# Patient Record
Sex: Female | Born: 1990 | Race: Asian | Hispanic: No | Smoking: Never smoker
Health system: Southern US, Community
[De-identification: ages and names within clinical notes are randomized; demographics above are authoritative.]

## PROBLEM LIST (undated history)

## (undated) DIAGNOSIS — D649 Anemia, unspecified: Secondary | ICD-10-CM

## (undated) DIAGNOSIS — Z87442 Personal history of urinary calculi: Secondary | ICD-10-CM

## (undated) DIAGNOSIS — N201 Calculus of ureter: Secondary | ICD-10-CM

## (undated) HISTORY — DX: Personal history of urinary calculi: Z87.442

## (undated) HISTORY — PX: FOOT SURGERY: SHX648

## (undated) HISTORY — DX: Anemia, unspecified: D64.9

---

## 2013-06-05 ENCOUNTER — Ambulatory Visit (INDEPENDENT_AMBULATORY_CARE_PROVIDER_SITE_OTHER): Payer: No Typology Code available for payment source | Admitting: Podiatry

## 2013-06-05 ENCOUNTER — Encounter: Payer: Self-pay | Admitting: Podiatry

## 2013-06-05 VITALS — BP 113/64 | HR 84 | Ht 65.0 in | Wt 131.0 lb

## 2013-06-05 DIAGNOSIS — M21619 Bunion of unspecified foot: Secondary | ICD-10-CM

## 2013-06-05 DIAGNOSIS — M216X9 Other acquired deformities of unspecified foot: Secondary | ICD-10-CM

## 2013-06-05 DIAGNOSIS — M2010 Hallux valgus (acquired), unspecified foot: Secondary | ICD-10-CM | POA: Insufficient documentation

## 2013-06-05 NOTE — Progress Notes (Signed)
Subjective: 23 year old female presents requesting her bunions fixed. She is on her feet at work 8-12 hours/day.   Review of Systems - General ROS: negative for - chills, fatigue, night sweats or sleep disturbance Ophthalmic ROS: negative ENT ROS: negative Allergy and Immunology ROS: negative Breast ROS: negative for breast lumps Respiratory ROS: no cough, shortness of breath, or wheezing Cardiovascular ROS: no chest pain or dyspnea on exertion Gastrointestinal ROS: no abdominal pain, change in bowel habits, or black or bloody stools Genito-Urinary ROS: no dysuria, trouble voiding, or hematuria Musculoskeletal ROS: negative Neurological ROS: no TIA or stroke symptoms Dermatological ROS: negative.  Objective: Dermatologic: Normotrophic skin without any lesions. Vascular: All pedal pulses are palpable. No edema or erythema noted. Neurologic: All epicritic and tactile sensations grossly intact.  Orthopedic: Severe hallux valgus with bunion deformity. Subluxed first MPJ bilateral. Hypermobility of the first ray bilateral.  Pes planus with weight bearing bilateral.   Assessment: Subluxed first MPJ with severe bunion bilateral. Hypermobile first ray bilateral.   Plan: Reviewed findings and available options; change in activity and shoe gear, orthotics, surgical options. Patient will return to discuss further on next visit.

## 2013-06-05 NOTE — Patient Instructions (Addendum)
Seen for bilateral bunion deformity. May benefit from Lapidus fusion procedure. Will contact patient with insurance into. Also need Custom orthotics.

## 2013-06-10 ENCOUNTER — Ambulatory Visit (INDEPENDENT_AMBULATORY_CARE_PROVIDER_SITE_OTHER): Payer: No Typology Code available for payment source | Admitting: Podiatry

## 2013-06-10 ENCOUNTER — Encounter: Payer: Self-pay | Admitting: Podiatry

## 2013-06-10 VITALS — BP 111/66 | HR 72 | Ht 65.0 in | Wt 131.0 lb

## 2013-06-10 DIAGNOSIS — M21619 Bunion of unspecified foot: Secondary | ICD-10-CM

## 2013-06-10 DIAGNOSIS — M2010 Hallux valgus (acquired), unspecified foot: Secondary | ICD-10-CM

## 2013-06-10 DIAGNOSIS — M216X9 Other acquired deformities of unspecified foot: Secondary | ICD-10-CM

## 2013-06-10 DIAGNOSIS — M21969 Unspecified acquired deformity of unspecified lower leg: Secondary | ICD-10-CM

## 2013-06-10 NOTE — Progress Notes (Signed)
Subjective: 23 year old female presents with her Grandfather requesting surgical intervention of her painful bunion, right foot this time.  Objective: No change in findings from previous visit. Severely deviated hallux with increased first intermetatarsal angle bilateral. Subluxed hallux on metatarsal head bilateral.  Severely hypermobile first metatarsal at the base.    Assessment: Severe HAV with bunion deformity bilateral. Severe hypermobility of the first MCJ bilateral.  Plan: Discussed the findings and available surgical options; Lapidus fusion of the first MCJ and McBride bunionectomy right. Surgery consent form was discussed.

## 2013-06-10 NOTE — Patient Instructions (Signed)
Seen for pre op consultation on right foot bunion.  Reviewed consent form for procedures: Lapidus fusion of the first Metatarsocuneiform joint right and McBride bunionectomy right.  All questions answered.  Please call if you have any further questions regard to the above procedures.

## 2013-06-13 DIAGNOSIS — Q66219 Congenital metatarsus primus varus, unspecified foot: Secondary | ICD-10-CM

## 2013-06-13 DIAGNOSIS — M2010 Hallux valgus (acquired), unspecified foot: Secondary | ICD-10-CM

## 2013-06-13 HISTORY — PX: OTHER SURGICAL HISTORY: SHX169

## 2013-06-18 ENCOUNTER — Encounter: Payer: Self-pay | Admitting: Podiatry

## 2013-06-18 ENCOUNTER — Ambulatory Visit (INDEPENDENT_AMBULATORY_CARE_PROVIDER_SITE_OTHER): Payer: No Typology Code available for payment source | Admitting: Podiatry

## 2013-06-18 VITALS — BP 108/58 | HR 69

## 2013-06-18 DIAGNOSIS — Z9889 Other specified postprocedural states: Secondary | ICD-10-CM

## 2013-06-18 DIAGNOSIS — M2010 Hallux valgus (acquired), unspecified foot: Secondary | ICD-10-CM

## 2013-06-18 DIAGNOSIS — M216X9 Other acquired deformities of unspecified foot: Secondary | ICD-10-CM

## 2013-06-18 DIAGNOSIS — M21969 Unspecified acquired deformity of unspecified lower leg: Secondary | ICD-10-CM

## 2013-06-18 NOTE — Progress Notes (Signed)
Subjective: 5 day post op patient presents accompanied by her grandfather reporting that she is doing well without problems with the foot or with medication. No fever or chills reported. Good color on all digits with movement right foot under cast. Walking with crutches. Return in 3 weeks.

## 2013-06-18 NOTE — Patient Instructions (Signed)
Follow up on 5 day post op. Doing well with cast and crutches. Walk in semi weight bearing with crutches. Return in 3 weeks.

## 2013-07-05 ENCOUNTER — Ambulatory Visit (INDEPENDENT_AMBULATORY_CARE_PROVIDER_SITE_OTHER): Payer: No Typology Code available for payment source | Admitting: Podiatry

## 2013-07-05 ENCOUNTER — Encounter: Payer: Self-pay | Admitting: Podiatry

## 2013-07-05 VITALS — BP 109/58 | HR 79

## 2013-07-05 DIAGNOSIS — M216X9 Other acquired deformities of unspecified foot: Secondary | ICD-10-CM

## 2013-07-05 DIAGNOSIS — M21969 Unspecified acquired deformity of unspecified lower leg: Secondary | ICD-10-CM

## 2013-07-05 DIAGNOSIS — M21619 Bunion of unspecified foot: Secondary | ICD-10-CM

## 2013-07-05 NOTE — Patient Instructions (Signed)
Cast replaced. Doing well. Continue to use crutches and may walk in semi-weight bearing. Return in 3 weeks.

## 2013-07-05 NOTE — Progress Notes (Signed)
3 weeks following Lapidus fusion with McBride right foot. Wearing Cast with walking heel on right lower limb. Stated that she has not been on pain pills for a while.  Cast removed. Wound is clean and dry without abnormal findings. X-ray show internal fixation in place with maintained correction, plantar flexed first ray right.  No new acute changes seen. Right lower limb placed in Fiberglass cast and cast boot dispensed.

## 2013-07-26 ENCOUNTER — Ambulatory Visit (INDEPENDENT_AMBULATORY_CARE_PROVIDER_SITE_OTHER): Payer: No Typology Code available for payment source | Admitting: Podiatry

## 2013-07-26 ENCOUNTER — Encounter: Payer: Self-pay | Admitting: Podiatry

## 2013-07-26 VITALS — BP 118/67 | HR 86

## 2013-07-26 DIAGNOSIS — M216X1 Other acquired deformities of right foot: Secondary | ICD-10-CM

## 2013-07-26 DIAGNOSIS — M21961 Unspecified acquired deformity of right lower leg: Secondary | ICD-10-CM

## 2013-07-26 DIAGNOSIS — M2010 Hallux valgus (acquired), unspecified foot: Secondary | ICD-10-CM

## 2013-07-26 DIAGNOSIS — M21969 Unspecified acquired deformity of unspecified lower leg: Secondary | ICD-10-CM

## 2013-07-26 DIAGNOSIS — M2011 Hallux valgus (acquired), right foot: Secondary | ICD-10-CM

## 2013-07-26 DIAGNOSIS — M216X9 Other acquired deformities of unspecified foot: Secondary | ICD-10-CM

## 2013-07-26 NOTE — Progress Notes (Signed)
6 weeks status post Lapidus with McBride right foot. Patient presents aided by crutches.  Stated that she is not taking pain medication any more.  Objective:  Right foot in cast with good maintenance.  Cast removed. Minimum edema noted. Well coapted surgical incision right foot.  Post op X-ray show internal fixation screws and plate in place. Maintained correction.   Assessment: Normal post op progress following Lapidus fusion and McBride right.   Plan: Right lower limb placed in CAM walker.  Instructed to walk in CAM walker and use crutches as needed.

## 2013-07-26 NOTE — Patient Instructions (Signed)
6 week post op. Doing well. May ambulate in CAM walker aided by crutches. Return in 3 weeks.

## 2013-08-16 ENCOUNTER — Encounter: Payer: Self-pay | Admitting: Podiatry

## 2013-08-16 ENCOUNTER — Ambulatory Visit (INDEPENDENT_AMBULATORY_CARE_PROVIDER_SITE_OTHER): Payer: No Typology Code available for payment source | Admitting: Podiatry

## 2013-08-16 VITALS — BP 113/66 | HR 74

## 2013-08-16 DIAGNOSIS — Z9889 Other specified postprocedural states: Secondary | ICD-10-CM

## 2013-08-16 NOTE — Progress Notes (Signed)
9 weeks status post Lapidus(06/13/13) right foot. Still wearing CAM walker.   Objective: Firm plantar flexed first ray. No edema noted. Wound has healed well.  Assessment: Satisfactory post op recovery from Lapidus fusion right foot.  Plan: Will try regular tennis shoes with Orthotics. Otc Orthotics dispensed.

## 2013-08-16 NOTE — Patient Instructions (Signed)
9 th week Post op check.  Wound healing is satisfactory. Doing well with CAM walker. May try regular tennis shoes with orthotics. OTC orthotics dispensed. Return in 3 weeks.

## 2013-09-06 ENCOUNTER — Ambulatory Visit (INDEPENDENT_AMBULATORY_CARE_PROVIDER_SITE_OTHER): Payer: No Typology Code available for payment source | Admitting: Podiatry

## 2013-09-06 ENCOUNTER — Encounter: Payer: Self-pay | Admitting: Podiatry

## 2013-09-06 VITALS — BP 103/64 | HR 79

## 2013-09-06 DIAGNOSIS — Z9889 Other specified postprocedural states: Secondary | ICD-10-CM

## 2013-09-06 NOTE — Patient Instructions (Signed)
12 weeks follow up on right foot surgery. Doing well. Increase activity as tolerated. Return as needed.

## 2013-09-06 NOTE — Progress Notes (Signed)
Subjective: 23 year old female presents follow up on Lapidus fusion right foot, 12 weeks now.  Able to walk comfortably and wears shoe inserts. Occasional mild edema noted by patient but no pain.  Objective: Solid first MCJ with good plantar flexion of the first ray right. Good ankle joint and first MPJ motion noted. No edema noted.   Assessment: Satisfactory post op recovery from Lapidus fusion right.  Plan: Increase activity as tolerated. Return as needed.

## 2015-10-02 ENCOUNTER — Inpatient Hospital Stay: Admit: 2015-10-02 | Payer: Self-pay | Admitting: Urology

## 2015-10-02 ENCOUNTER — Observation Stay (HOSPITAL_BASED_OUTPATIENT_CLINIC_OR_DEPARTMENT_OTHER)
Admission: EM | Admit: 2015-10-02 | Discharge: 2015-10-03 | Disposition: A | Discharge status: Home / Self Care | Payer: Managed Care, Other (non HMO) | Attending: Urology | Admitting: Urology

## 2015-10-02 ENCOUNTER — Emergency Department (HOSPITAL_COMMUNITY): Payer: Managed Care, Other (non HMO) | Admitting: Certified Registered Nurse Anesthetist

## 2015-10-02 ENCOUNTER — Emergency Department (HOSPITAL_BASED_OUTPATIENT_CLINIC_OR_DEPARTMENT_OTHER): Payer: Managed Care, Other (non HMO)

## 2015-10-02 ENCOUNTER — Encounter (HOSPITAL_COMMUNITY)
Admission: EM | Disposition: A | Discharge status: Home / Self Care | Payer: Self-pay | Source: Home / Self Care | Attending: Emergency Medicine

## 2015-10-02 ENCOUNTER — Encounter (HOSPITAL_BASED_OUTPATIENT_CLINIC_OR_DEPARTMENT_OTHER): Payer: Self-pay

## 2015-10-02 DIAGNOSIS — N136 Pyonephrosis: Secondary | ICD-10-CM | POA: Diagnosis not present

## 2015-10-02 DIAGNOSIS — N200 Calculus of kidney: Secondary | ICD-10-CM

## 2015-10-02 DIAGNOSIS — N390 Urinary tract infection, site not specified: Secondary | ICD-10-CM | POA: Insufficient documentation

## 2015-10-02 DIAGNOSIS — R109 Unspecified abdominal pain: Secondary | ICD-10-CM

## 2015-10-02 DIAGNOSIS — N201 Calculus of ureter: Secondary | ICD-10-CM | POA: Diagnosis present

## 2015-10-02 DIAGNOSIS — A419 Sepsis, unspecified organism: Secondary | ICD-10-CM | POA: Insufficient documentation

## 2015-10-02 HISTORY — PX: CYSTOSCOPY WITH STENT PLACEMENT: SHX5790

## 2015-10-02 LAB — COMPREHENSIVE METABOLIC PANEL
ALBUMIN: 4.6 g/dL (ref 3.5–5.0)
ALT: 17 U/L (ref 14–54)
AST: 20 U/L (ref 15–41)
Alkaline Phosphatase: 47 U/L (ref 38–126)
Anion gap: 11 (ref 5–15)
BUN: 12 mg/dL (ref 6–20)
CO2: 21 mmol/L — AB (ref 22–32)
CREATININE: 0.67 mg/dL (ref 0.44–1.00)
Calcium: 9.1 mg/dL (ref 8.9–10.3)
Chloride: 105 mmol/L (ref 101–111)
GFR calc Af Amer: >60 mL/min (ref >60)
Glucose, Bld: 97 mg/dL (ref 65–99)
Potassium: 3.3 mmol/L — ABNORMAL LOW (ref 3.5–5.1)
Sodium: 137 mmol/L (ref 135–145)
Total Bilirubin: 1.3 mg/dL — ABNORMAL HIGH (ref 0.3–1.2)
Total Protein: 7.4 g/dL (ref 6.5–8.1)

## 2015-10-02 LAB — URINE MICROSCOPIC-ADD ON

## 2015-10-02 LAB — URINALYSIS, ROUTINE W REFLEX MICROSCOPIC
Bilirubin Urine: NEGATIVE
GLUCOSE, UA: NEGATIVE mg/dL
Ketones, ur: 40 mg/dL — AB
NITRITE: POSITIVE — AB
Protein, ur: 30 mg/dL — AB
Specific Gravity, Urine: 1.019 (ref 1.005–1.030)
pH: 6 (ref 5.0–8.0)

## 2015-10-02 LAB — CBC WITH DIFFERENTIAL/PLATELET
BASOS ABS: 0 10*3/uL (ref 0.0–0.1)
Basophils Relative: 0 %
Eosinophils Absolute: 0 10*3/uL (ref 0.0–0.7)
Eosinophils Relative: 0 %
HCT: 33.7 % — ABNORMAL LOW (ref 36.0–46.0)
HEMOGLOBIN: 11.2 g/dL — AB (ref 12.0–15.0)
LYMPHS ABS: 0.4 10*3/uL — AB (ref 0.7–4.0)
LYMPHS PCT: 2 %
MCH: 27.9 pg (ref 26.0–34.0)
MCHC: 33.2 g/dL (ref 30.0–36.0)
MCV: 83.8 fL (ref 78.0–100.0)
Monocytes Absolute: 1.1 10*3/uL — ABNORMAL HIGH (ref 0.1–1.0)
Monocytes Relative: 6 %
NEUTROS ABS: 17.5 10*3/uL — AB (ref 1.7–7.7)
NEUTROS PCT: 92 %
PLATELETS: 182 10*3/uL (ref 150–400)
RBC: 4.02 MIL/uL (ref 3.87–5.11)
RDW: 12.1 % (ref 11.5–15.5)
WBC: 18.9 10*3/uL — AB (ref 4.0–10.5)

## 2015-10-02 LAB — PREGNANCY, URINE: PREG TEST UR: NEGATIVE

## 2015-10-02 SURGERY — CYSTOSCOPY, WITH STENT INSERTION
Anesthesia: General | Site: Urethra | Laterality: Left

## 2015-10-02 MED ORDER — DEXTROSE 5 % IV SOLN
1.0000 g | Freq: Once | INTRAVENOUS | Status: AC
  Administered 2015-10-02: 1 g via INTRAVENOUS
  Filled 2015-10-02: qty 10

## 2015-10-02 MED ORDER — STERILE WATER FOR IRRIGATION IR SOLN
Status: DC | PRN
  Administered 2015-10-02: 3000 mL via INTRAVESICAL

## 2015-10-02 MED ORDER — ONDANSETRON HCL 4 MG/2ML IJ SOLN: 4.0000 mg | INTRAMUSCULAR | Status: DC | PRN

## 2015-10-02 MED ORDER — FENTANYL CITRATE (PF) 100 MCG/2ML IJ SOLN
INTRAMUSCULAR | Status: DC | PRN
  Administered 2015-10-02 (×2): 50 ug via INTRAVENOUS

## 2015-10-02 MED ORDER — LACTATED RINGERS IV SOLN
INTRAVENOUS | Status: DC | PRN
  Administered 2015-10-02: 22:00:00 via INTRAVENOUS

## 2015-10-02 MED ORDER — OXYCODONE-ACETAMINOPHEN 5-325 MG PO TABS: 1.0000 | ORAL_TABLET | ORAL | Status: DC | PRN

## 2015-10-02 MED ORDER — HYDROMORPHONE HCL 1 MG/ML IJ SOLN: 0.5000 mg | INTRAMUSCULAR | Status: DC | PRN

## 2015-10-02 MED ORDER — HYDROMORPHONE HCL 1 MG/ML IJ SOLN: 0.2500 mg | INTRAMUSCULAR | Status: DC | PRN

## 2015-10-02 MED ORDER — SUCCINYLCHOLINE CHLORIDE 20 MG/ML IJ SOLN
INTRAMUSCULAR | Status: DC | PRN
  Administered 2015-10-02: 60 mg via INTRAVENOUS

## 2015-10-02 MED ORDER — DIPHENHYDRAMINE HCL 12.5 MG/5ML PO ELIX: 12.5000 mg | ORAL_SOLUTION | Freq: Four times a day (QID) | ORAL | Status: DC | PRN

## 2015-10-02 MED ORDER — MIDAZOLAM HCL 5 MG/5ML IJ SOLN
INTRAMUSCULAR | Status: DC | PRN
  Administered 2015-10-02: 2 mg via INTRAVENOUS

## 2015-10-02 MED ORDER — PHENYLEPHRINE HCL 10 MG/ML IJ SOLN
INTRAMUSCULAR | Status: DC | PRN
  Administered 2015-10-02 (×3): 80 ug via INTRAVENOUS

## 2015-10-02 MED ORDER — DEXTROSE 5 % IV SOLN
2.0000 g | INTRAVENOUS | Status: DC
  Filled 2015-10-02: qty 2

## 2015-10-02 MED ORDER — DIPHENHYDRAMINE HCL 50 MG/ML IJ SOLN
12.5000 mg | Freq: Four times a day (QID) | INTRAMUSCULAR | Status: DC | PRN
Start: 2015-10-02 — End: 2015-10-03

## 2015-10-02 MED ORDER — KETOROLAC TROMETHAMINE 30 MG/ML IJ SOLN
30.0000 mg | Freq: Once | INTRAMUSCULAR | Status: AC
  Administered 2015-10-02: 30 mg via INTRAVENOUS
  Filled 2015-10-02: qty 1

## 2015-10-02 MED ORDER — ZOLPIDEM TARTRATE 5 MG PO TABS: 5.0000 mg | ORAL_TABLET | Freq: Every evening | ORAL | Status: DC | PRN

## 2015-10-02 MED ORDER — HYOSCYAMINE SULFATE 0.125 MG SL SUBL
0.1250 mg | SUBLINGUAL_TABLET | SUBLINGUAL | Status: DC | PRN
  Filled 2015-10-02: qty 1

## 2015-10-02 MED ORDER — PROMETHAZINE HCL 25 MG/ML IJ SOLN
6.2500 mg | INTRAMUSCULAR | Status: DC | PRN
Start: 2015-10-02 — End: 2015-10-02

## 2015-10-02 MED ORDER — SODIUM CHLORIDE 0.9 % IV SOLN
Freq: Once | INTRAVENOUS | Status: AC
  Administered 2015-10-02: 20:00:00 via INTRAVENOUS

## 2015-10-02 MED ORDER — MEPERIDINE HCL 50 MG/ML IJ SOLN: 6.2500 mg | INTRAMUSCULAR | Status: DC | PRN

## 2015-10-02 MED ORDER — SODIUM CHLORIDE 0.9 % IV SOLN
INTRAVENOUS | Status: DC | PRN
  Administered 2015-10-02: 8 mL

## 2015-10-02 MED ORDER — ONDANSETRON HCL 4 MG/2ML IJ SOLN
INTRAMUSCULAR | Status: DC | PRN
  Administered 2015-10-02: 4 mg via INTRAVENOUS

## 2015-10-02 MED ORDER — ACETAMINOPHEN 325 MG PO TABS: 650.0000 mg | ORAL_TABLET | ORAL | Status: DC | PRN

## 2015-10-02 MED ORDER — SODIUM CHLORIDE 0.9 % IV SOLN
INTRAVENOUS | Status: DC
  Administered 2015-10-03 (×2): via INTRAVENOUS

## 2015-10-02 MED ORDER — LIDOCAINE HCL (CARDIAC) 20 MG/ML IV SOLN
INTRAVENOUS | Status: DC | PRN
  Administered 2015-10-02: 50 mg via INTRAVENOUS

## 2015-10-02 MED ORDER — PROPOFOL 10 MG/ML IV BOLUS
INTRAVENOUS | Status: DC | PRN
  Administered 2015-10-02: 150 mg via INTRAVENOUS

## 2015-10-02 MED ORDER — LACTATED RINGERS IV SOLN: INTRAVENOUS | Status: DC

## 2015-10-02 SURGICAL SUPPLY — 13 items
BAG URO CATCHER STRL LF (MISCELLANEOUS) ×3 IMPLANT
CATH FOLEY 2WAY SLVR  5CC 16FR (CATHETERS) ×2
CATH FOLEY 2WAY SLVR 5CC 16FR (CATHETERS) ×1 IMPLANT
CATH INTERMIT  6FR 70CM (CATHETERS) ×3 IMPLANT
CLOTH BEACON ORANGE TIMEOUT ST (SAFETY) ×3 IMPLANT
GLOVE BIOGEL M 8.0 STRL (GLOVE) ×3 IMPLANT
GOWN STRL REUS W/TWL LRG LVL3 (GOWN DISPOSABLE) ×6 IMPLANT
GUIDEWIRE STR DUAL SENSOR (WIRE) ×3 IMPLANT
MANIFOLD NEPTUNE II (INSTRUMENTS) ×3 IMPLANT
PACK CYSTO (CUSTOM PROCEDURE TRAY) ×3 IMPLANT
STENT URET 6FRX24 CONTOUR (STENTS) ×3 IMPLANT
TUBING CONNECTING 10 (TUBING) ×2 IMPLANT
TUBING CONNECTING 10' (TUBING) ×1

## 2015-10-02 NOTE — H&P (Signed)
Urology Admission H&P  Chief Complaint: left flank pain  History of Present Illness: Alejandra Russo is a 25yo with no significant past medical history who presented to [facility] with a 1 day history of left sided abdominal and flank pain. The pain is sharp, constant, severe, nonradiating with associated nausea and vomiting. She also has urgency, dysuria. No exacerbating/alleviating events WBC count is 19 and Urine is concerning for infection. CT scan shows a 4mm left distal ureterla calculus with moderate hydronephrosis  History reviewed. No pertinent past medical history. Past Surgical History:  Procedure Laterality Date  . FOOT SURGERY    . Lapidus Fusion Right 06/13/2013   @ [facility]  . Adin Hector Bunionectomy Right 06/13/2013   @ [facility]    Home Medications:   (Not in a hospital admission) Allergies: No Known Allergies  History reviewed. No pertinent family history. Social History:  reports that she has never smoked. She has never used smokeless tobacco. She reports that she drinks alcohol. She reports that she does not use drugs.  Review of Systems  Gastrointestinal: Positive for nausea and vomiting.  Genitourinary: Positive for dysuria, flank pain, frequency and urgency.  All other systems reviewed and are negative.   Physical Exam:  Vital signs in last 24 hours: Temp:  [98.2 F (36.8 C)] 98.2 F (36.8 C) (09/01 1652) Pulse Rate:  [98-118] 98 (09/01 1857) Resp:  [18-20] 18 (09/01 1857) BP: (103-106)/(69-95) 103/69 (09/01 1857) SpO2:  [100 %] 100 % (09/01 1857) Weight:  [62.1 kg (137 lb)] 62.1 kg (137 lb) (09/01 1652) Physical Exam  Constitutional: She is oriented to person, place, and time. She appears well-developed and well-nourished.  HENT:  Head: Normocephalic and atraumatic.  Eyes: EOM are normal. Pupils are equal, round, and reactive to light.  Neck: Normal range of motion. No thyromegaly present.  Cardiovascular: Normal rate and regular rhythm.   Respiratory:  Effort normal. No respiratory distress.  GI: Soft. She exhibits no distension and no mass. There is no tenderness. There is no rebound and no guarding.  Musculoskeletal: Normal range of motion. She exhibits no edema.  Neurological: She is alert and oriented to person, place, and time.  Skin: Skin is warm and dry.  Psychiatric: She has a normal mood and affect. Her behavior is normal. Judgment and thought content normal.    Laboratory Data:  Results for orders placed or performed during the hospital encounter of 10/02/15 (from the past 24 hour(s))  Pregnancy, urine     Status: None   Collection Time: 10/02/15  5:20 PM  Result Value Ref Range   Preg Test, Ur NEGATIVE NEGATIVE  Urinalysis, Routine w reflex microscopic (not at [facility])     Status: Abnormal   Collection Time: 10/02/15  5:20 PM  Result Value Ref Range   Color, Urine AMBER (A) YELLOW   APPearance CLOUDY (A) CLEAR   Specific Gravity, Urine 1.019 1.005 - 1.030   pH 6.0 5.0 - 8.0   Glucose, UA NEGATIVE NEGATIVE mg/dL   Hgb urine dipstick LARGE (A) NEGATIVE   Bilirubin Urine NEGATIVE NEGATIVE   Ketones, ur 40 (A) NEGATIVE mg/dL   Protein, ur 30 (A) NEGATIVE mg/dL   Nitrite POSITIVE (A) NEGATIVE   Leukocytes, UA LARGE (A) NEGATIVE  Urine microscopic-add on     Status: Abnormal   Collection Time: 10/02/15  5:20 PM  Result Value Ref Range   Squamous Epithelial / LPF 0-5 (A) NONE SEEN   WBC, UA 6-30 0 - 5 WBC/hpf  RBC / HPF TOO NUMEROUS TO COUNT 0 - 5 RBC/hpf   Bacteria, UA MANY (A) NONE SEEN   Urine-Other MUCOUS PRESENT   Comprehensive metabolic panel     Status: Abnormal   Collection Time: 10/02/15  5:30 PM  Result Value Ref Range   Sodium 137 135 - 145 mmol/L   Potassium 3.3 (L) 3.5 - 5.1 mmol/L   Chloride 105 101 - 111 mmol/L   CO2 21 (L) 22 - 32 mmol/L   Glucose, Bld 97 65 - 99 mg/dL   BUN 12 6 - 20 mg/dL   Creatinine, Ser 6.040.67 0.44 - 1.00 mg/dL   Calcium 9.1 8.9 - 54.010.3 mg/dL   Total Protein 7.4 6.5 - 8.1 g/dL    Albumin 4.6 3.5 - 5.0 g/dL   AST 20 15 - 41 U/L   ALT 17 14 - 54 U/L   Alkaline Phosphatase 47 38 - 126 U/L   Total Bilirubin 1.3 (H) 0.3 - 1.2 mg/dL   GFR calc non Af Amer >60 >60 mL/min   GFR calc Af Amer >60 >60 mL/min   Anion gap 11 5 - 15  CBC with Differential     Status: Abnormal   Collection Time: 10/02/15  5:30 PM  Result Value Ref Range   WBC 18.9 (H) 4.0 - 10.5 K/uL   RBC 4.02 3.87 - 5.11 MIL/uL   Hemoglobin 11.2 (L) 12.0 - 15.0 g/dL   HCT 98.133.7 (L) 19.136.0 - 47.846.0 %   MCV 83.8 78.0 - 100.0 fL   MCH 27.9 26.0 - 34.0 pg   MCHC 33.2 30.0 - 36.0 g/dL   RDW 29.512.1 62.111.5 - 30.815.5 %   Platelets 182 150 - 400 K/uL   Neutrophils Relative % 92 %   Neutro Abs 17.5 (H) 1.7 - 7.7 K/uL   Lymphocytes Relative 2 %   Lymphs Abs 0.4 (L) 0.7 - 4.0 K/uL   Monocytes Relative 6 %   Monocytes Absolute 1.1 (H) 0.1 - 1.0 K/uL   Eosinophils Relative 0 %   Eosinophils Absolute 0.0 0.0 - 0.7 K/uL   Basophils Relative 0 %   Basophils Absolute 0.0 0.0 - 0.1 K/uL   No results found for this or any previous visit (from the past 240 hour(s)). Creatinine:  Recent Labs  10/02/15 1730  CREATININE 0.67   Baseline Creatinine: 0.7  Impression/Assessment:  25yo with left ureteral calculus, sepsis from a urinary source  Plan:  1. The risks/benefits/alternatives to left ureteral stent placement was explained to the patient and she understands and wishes to proceed with surgery 2. She will be admitted for IV antibiotics and CV monitoring post op.   Wilkie Ayeatrick Micaiah Remillard 10/02/2015, 8:36 PM

## 2015-10-02 NOTE — ED Notes (Addendum)
Pt from [facility] via [facility]---- was [facility] for further evaluation and treatment of hydronephrosis from multiple kidney stones.  Pt arrived to Rm 24 A/Ox4, denies pain and in no s/s apparent distress.  On-going NS at 100 ml/hr to right AC infusing well.

## 2015-10-02 NOTE — ED Notes (Signed)
Dr. Ronne BinningMcKenzie, general surgeon, at bedside at this time.

## 2015-10-02 NOTE — ED Notes (Signed)
Patient transported to CT 

## 2015-10-02 NOTE — ED Provider Notes (Signed)
Emergency Department Provider Note   I have reviewed the triage vital signs and the nursing notes.   HISTORY  Chief Complaint Abdominal Pain   HPI Barton Fannyshlie Ormand is a 25 y.o. female with no significant PMH presents to the emergency room for evaluation of sudden onset left upper quadrant pain that has been intermittent since 1 PM today. Patient with no prior history of similar pain episodes. She denies associated vomiting, chest pain, difficulty breathing. She notes possible onset with eating. No diarrhea. No lower abdominal discomfort. Denies vaginal bleeding or discharge. LMP was 09/08/2015. Patient went to urgent care and was found to have hematuria and referred to the emergency department for further evaluation. No history of kidney stone.    History reviewed. No pertinent past medical history.  Patient Active Problem List   Diagnosis Date Noted  . Status post foot surgery 06/18/2013  . Metatarsal deformity 06/10/2013  . 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 Bunion 06/05/2013  . HAV (hallux abducto valgus) 06/05/2013    Past Surgical History:  Procedure Laterality Date  . FOOT SURGERY    . Lapidus Fusion Right 06/13/2013   @ [facility]  . Adin HectorMcBride Bunionectomy Right 06/13/2013   @ [facility]      Allergies Review of patient's allergies indicates no known allergies.  History reviewed. No pertinent family history.  Social History Social History  Substance Use Topics  . Smoking status: Never Smoker  . Smokeless tobacco: Never Used  . Alcohol use Yes     Comment: ooc    Review of Systems  Constitutional: No fever/chills Eyes: No visual changes. ENT: No sore throat. Cardiovascular: Denies chest pain. Respiratory: Denies shortness of breath. Gastrointestinal: LUQ abdominal pain.  No nausea, no vomiting.  No diarrhea.  No constipation. Genitourinary: Negative for dysuria. Musculoskeletal: Negative for back pain. Skin: Negative for rash. Neurological:  Negative for headaches, focal weakness or numbness.  10-point ROS otherwise negative.  ____________________________________________   PHYSICAL EXAM:  VITAL SIGNS: ED Triage Vitals  Enc Vitals Group     BP 10/02/15 1652 106/95     Pulse Rate 10/02/15 1652 118     Resp 10/02/15 1652 20     Temp 10/02/15 1652 98.2 F (36.8 C)     Temp Source 10/02/15 1652 Oral     SpO2 10/02/15 1652 100 %     Weight 10/02/15 1652 137 lb (62.1 kg)     Height 10/02/15 1652 5\' 4"  (1.626 m)     Pain Score 10/02/15 1651 6   Constitutional: Alert and oriented. Well appearing and in no acute distress. Eyes: Conjunctivae are normal. . Head: Atraumatic. Nose: No congestion/rhinnorhea. Mouth/Throat: Mucous membranes are moist.  Oropharynx non-erythematous. Neck: No stridor.   Cardiovascular: Sinus tachycardia. Good peripheral circulation. Grossly normal heart sounds.   Respiratory: Normal respiratory effort.  No retractions. Lungs CTAB. Gastrointestinal: Soft and nontender. No distention. Mild left CVA tenderness to percussion.  Musculoskeletal: No lower extremity tenderness nor edema. No gross deformities of extremities. Neurologic:  Normal speech and language. No gross focal neurologic deficits are appreciated.  Skin:  Skin is warm, dry and intact. No rash noted. Psychiatric: Mood and affect are normal. Speech and behavior are normal.  ____________________________________________   LABS (all labs ordered are listed, but only abnormal results are displayed)  Labs Reviewed  URINALYSIS, ROUTINE W REFLEX MICROSCOPIC (NOT AT [facility]) - Abnormal; Notable for the following:       Result Value   Color, Urine AMBER (*)  APPearance CLOUDY (*)    Hgb urine dipstick LARGE (*)    Ketones, ur 40 (*)    Protein, ur 30 (*)    Nitrite POSITIVE (*)    Leukocytes, UA LARGE (*)    All other components within normal limits  COMPREHENSIVE METABOLIC PANEL - Abnormal; Notable for the following:    Potassium 3.3 (*)     CO2 21 (*)    Total Bilirubin 1.3 (*)    All other components within normal limits  CBC WITH DIFFERENTIAL/PLATELET - Abnormal; Notable for the following:    WBC 18.9 (*)    Hemoglobin 11.2 (*)    HCT 33.7 (*)    Neutro Abs 17.5 (*)    Lymphs Abs 0.4 (*)    Monocytes Absolute 1.1 (*)    All other components within normal limits  URINE MICROSCOPIC-ADD ON - Abnormal; Notable for the following:    Squamous Epithelial / LPF 0-5 (*)    Bacteria, UA MANY (*)    All other components within normal limits  PREGNANCY, URINE   ____________________________________________  RADIOLOGY  Ct Renal Stone Study  Result Date: 10/02/2015 CLINICAL DATA:  25 year old female with acute left flank and abdominal pain. EXAM: CT ABDOMEN AND PELVIS WITHOUT CONTRAST TECHNIQUE: Multidetector CT imaging of the abdomen and pelvis was performed following the standard protocol without IV contrast. COMPARISON:  None. FINDINGS: Please note that parenchymal abnormalities may be missed without intravenous contrast. Lower chest:  Unremarkable Hepatobiliary: The liver and gallbladder are unremarkable. There is no evidence of biliary dilatation. Pancreas: Unremarkable Spleen: Unremarkable Adrenals/Urinary Tract: Mild left hydroureteronephrosis noted into the pelvis. A 4 mm calcification in the region the distal left ureter probably represents a distal ureteral calculus. Multiple bilateral nonobstructing renal calculi are identified. The adrenal glands and bladder are unremarkable. Stomach/Bowel: Unremarkable. No evidence of bowel obstruction. The appendix is normal. Vascular/Lymphatic: Unremarkable. No enlarged lymph nodes or abdominal aortic aneurysm. Reproductive: Unremarkable Other: No free fluid or pneumoperitoneum. Musculoskeletal: No acute or suspicious abnormalities. IMPRESSION: Mild left hydronephrosis from probable 4 mm distal left ureteral calculus. Multiple bilateral nonobstructing renal calculi. Electronically Signed    By: Harmon Pier M.D.   On: 10/02/2015 18:33    ____________________________________________   PROCEDURES  Procedure(s) performed:   Procedures  None ____________________________________________   INITIAL IMPRESSION / ASSESSMENT AND PLAN / ED COURSE  Pertinent labs & imaging results that were available during my care of the patient were reviewed by me and considered in my medical decision making (see chart for details).  Patient presents to the emergency department for evaluation of left upper quadrant abdominal discomfort. Symptoms are intermittent. Noted to have hematuria at urgent care. No history of kidney stones this time a differential. Also obtain urine pregnancy with low suspicion for ectopic given no lower abdominal discomfort or vaginal bleeding. Patient is no right lower quadrant abdominal discomfort. No Murphy sign. Plan for baseline labs and Toradol pending pregnancy. Would consider a noncontrast CT scan of the abdomen and pelvis if no other clear source of patient's discomfort is found evaluate for kidney stone.   06:06 PM Patient with evidence of infection on urinalysis. No history of fever. Patient does have some CVA tenderness. Likely pyelonephritis but given the patient's severe intermittent sudden onset pain as her presenting complaint I plan to obtain CT scan of the abdomen and pelvis without contrast to rule out kidney stone with resulting infectious process.   07:25 PM Spoke with Dr. Ronne Binning with Urology regarding CT and  UTI. Plan to transfer to [facility] ED for urology evaluation and likely admission for stenting.    07:46 PM Spoke with Dr. Hedwig Morton, EPD at [facility] ED. Plan for ED-ED transfer for Urology to see. Updated patient and discussed care plan. Pain well controlled. No fever.  ____________________________________________  FINAL CLINICAL IMPRESSION(S) / ED DIAGNOSES  Final diagnoses:  Kidney stone  UTI (lower urinary tract infection)     MEDICATIONS GIVEN  DURING THIS VISIT:  Medications  ketorolac (TORADOL) 30 MG/ML injection 30 mg (30 mg Intravenous Given 10/02/15 1752)  cefTRIAXone (ROCEPHIN) 1 g in dextrose 5 % 50 mL IVPB (0 g Intravenous Stopped 10/02/15 1905)     NEW OUTPATIENT MEDICATIONS STARTED DURING THIS VISIT:  None   Note:  This document was prepared using Dragon voice recognition software and may include unintentional dictation errors.  Alona Bene, MD Emergency Medicine   Maia Plan, MD 10/03/15 [id number]

## 2015-10-02 NOTE — ED Notes (Signed)
Resting quietly, comfort measures provided, ED provider informed of K level of 3.3

## 2015-10-02 NOTE — Anesthesia Preprocedure Evaluation (Signed)
Anesthesia Evaluation  Patient identified by MRN, date of birth, ID band Patient awake    Reviewed: Allergy & Precautions, NPO status , Patient's Chart, lab work & pertinent test results  Airway Mallampati: II  TM Distance: >3 FB Neck ROM: Full    Dental no notable dental hx.    Pulmonary neg pulmonary ROS,    Pulmonary exam normal breath sounds clear to auscultation       Cardiovascular negative cardio ROS Normal cardiovascular exam Rhythm:Regular Rate:Normal     Neuro/Psych negative neurological ROS  negative psych ROS   GI/Hepatic negative GI ROS, Neg liver ROS,   Endo/Other  negative endocrine ROS  Renal/GU negative Renal ROS     Musculoskeletal negative musculoskeletal ROS (+)   Abdominal   Peds  Hematology negative hematology ROS (+)   Anesthesia Other Findings   Reproductive/Obstetrics negative OB ROS                             Anesthesia Physical Anesthesia Plan  ASA: I and emergent  Anesthesia Plan: General   Post-op Pain Management:    Induction: Intravenous  Airway Management Planned: Oral ETT  Additional Equipment:   Intra-op Plan:   Post-operative Plan: Extubation in OR  Informed Consent: I have reviewed the patients History and Physical, chart, labs and discussed the procedure including the risks, benefits and alternatives for the proposed anesthesia with the patient or authorized representative who has indicated his/her understanding and acceptance.   Dental advisory given  Plan Discussed with: CRNA  Anesthesia Plan Comments:         Anesthesia Quick Evaluation

## 2015-10-02 NOTE — Transfer of Care (Signed)
Immediate Anesthesia Transfer of Care Note  Patient: Alejandra Russo  Procedure(s) Performed: Procedure(s): CYSTOSCOPY WITH STENT PLACEMENT (Left)  Patient Location: PACU  Anesthesia Type:General  Level of Consciousness: sedated, patient cooperative and responds to stimulation  Airway & Oxygen Therapy: Patient Spontanous Breathing and Patient connected to face mask oxygen  Post-op Assessment: Report given to RN and Post -op Vital signs reviewed and stable  Post vital signs: Reviewed and stable  Last Vitals:  Vitals:   10/02/15 1857 10/02/15 2118  BP: 103/69 123/69  Pulse: 98 100  Resp: 18 18  Temp:  36.7 C    Last Pain:  Vitals:   10/02/15 2118  TempSrc: Oral  PainSc:          Complications: No apparent anesthesia complications

## 2015-10-02 NOTE — ED Provider Notes (Signed)
Patient feels well. Discussed with Dr. Ronne BinningMcKenzie, he has artery post of the patient for the OR and will take her up.   Alejandra LovelessScott Yaretsi Humphres, MD 10/02/15 2148

## 2015-10-02 NOTE — Anesthesia Procedure Notes (Signed)
Procedure Name: Intubation Performed by: Donabelle Molden J Pre-anesthesia Checklist: Patient identified, Emergency Drugs available, Suction available, Patient being monitored and Timeout performed Patient Re-evaluated:Patient Re-evaluated prior to inductionOxygen Delivery Method: Circle system utilized Preoxygenation: Pre-oxygenation with 100% oxygen Intubation Type: IV induction Ventilation: Mask ventilation without difficulty Laryngoscope Size: Mac and 3 Grade View: Grade I Tube type: Oral Tube size: 7.0 mm Number of attempts: 1 Airway Equipment and Method: Stylet Placement Confirmation: ETT inserted through vocal cords under direct vision,  positive ETCO2,  CO2 detector and breath sounds checked- equal and bilateral Secured at: 21 cm Tube secured with: Tape Dental Injury: Teeth and Oropharynx as per pre-operative assessment        

## 2015-10-02 NOTE — ED Notes (Signed)
Patient c/o sudden onset LLQ pain at 1pm today. Patient went to UC, was referred to the ER due to blood in her urine. Patient denies NVD. Patient states she is now having urine frequency. Patient A&Ox4, NAD noted.

## 2015-10-02 NOTE — ED Triage Notes (Signed)
C/o abd pain since 1pm-denies n/v/d-sent from urgent care for "blood in urine"-NAD-steady gait

## 2015-10-02 NOTE — Op Note (Signed)
.  Preoperative diagnosis: Left ureteral stone, sepsis  Postoperative diagnosis: Same  Procedure: 1 cystoscopy 2. Left retrograde pyelography 3.  Intraoperative fluoroscopy, under one hour, with interpretation 4. Left 6 x 24 JJ stent placement  Attending: Wilkie AyePatrick Nelson Noone  Anesthesia: General  Estimated blood loss: None  Drains: Left 6 x 24 JJ ureteral stent without tether, 16 French foley catheter  Specimens: none  Antibiotics: rocephin  Findings: left mid ureteral stone. Moderate hydronephrosis. No masses/lesions in the bladder. Ureteral orifices in normal anatomic location.  Indications: Patient is a 25 year old female with a history of left renal stone and concern for sepsis.  After discussing treatment options, they decided proceed with left stent placement.  Procedure her in detail: The patient was brought to the operating room and a brief timeout was done to ensure correct patient, correct procedure, correct site.  General anesthesia was administered patient was placed in dorsal lithotomy position.  Their genitalia was then prepped and draped in usual sterile fashion.  A rigid 22 French cystoscope was passed in the urethra and the bladder.  Bladder was inspected free masses or lesions.  the ureteral orifices were in the normal orthotopic locations.  a 6 french ureteral catheter was then instilled into the left ureteral orifice.  a gentle retrograde was obtained and findings noted above.  we then placed a zip wire through the ureteral catheter and advanced up to the renal pelvis.    We then placed a 6 x 24 double-j ureteral stent over the original zip wire.  We then removed the wire and good coil was noted in the the renal pelvis under fluoroscopy and the bladder under direct vision.  A foley catheter was then placed. the bladder was then drained and this concluded the procedure which was well tolerated by patient.  Complications: None  Condition: Stable, extubated, transferred to  PACU  Plan: Patient is to be admitted for IV antibiotics. She will have his stone extraction in 2 weeks.

## 2015-10-02 NOTE — Anesthesia Postprocedure Evaluation (Signed)
Anesthesia Post Note  Patient: Alejandra Russo  Procedure(s) Performed: Procedure(s) (LRB): CYSTOSCOPY WITH STENT PLACEMENT (Left)  Patient location during evaluation: PACU Anesthesia Type: General Level of consciousness: sedated and patient cooperative Pain management: pain level controlled Vital Signs Assessment: post-procedure vital signs reviewed and stable Respiratory status: spontaneous breathing Cardiovascular status: stable Anesthetic complications: no    Last Vitals:  Vitals:   10/02/15 2315 10/02/15 2330  BP: 106/74 109/68  Pulse: (!) 101 94  Resp: (!) 21 17  Temp:  37.2 C    Last Pain:  Vitals:   10/02/15 2330  TempSrc:   PainSc: 0-No pain                 Lewie LoronJohn Jazsmine Macari

## 2015-10-02 NOTE — ED Notes (Signed)
Patient returned from CT

## 2015-10-03 LAB — BASIC METABOLIC PANEL
Anion gap: 6 (ref 5–15)
BUN: 8 mg/dL (ref 6–20)
CHLORIDE: 108 mmol/L (ref 101–111)
CO2: 23 mmol/L (ref 22–32)
CREATININE: 0.58 mg/dL (ref 0.44–1.00)
Calcium: 8.5 mg/dL — ABNORMAL LOW (ref 8.9–10.3)
GFR calc Af Amer: >60 mL/min (ref >60)
GFR calc non Af Amer: >60 mL/min (ref >60)
Glucose, Bld: 98 mg/dL (ref 65–99)
Potassium: 3.4 mmol/L — ABNORMAL LOW (ref 3.5–5.1)
SODIUM: 137 mmol/L (ref 135–145)

## 2015-10-03 LAB — CBC
HEMATOCRIT: 30.8 % — AB (ref 36.0–46.0)
HEMOGLOBIN: 10.2 g/dL — AB (ref 12.0–15.0)
MCH: 27.8 pg (ref 26.0–34.0)
MCHC: 33.1 g/dL (ref 30.0–36.0)
MCV: 83.9 fL (ref 78.0–100.0)
Platelets: 186 10*3/uL (ref 150–400)
RBC: 3.67 MIL/uL — ABNORMAL LOW (ref 3.87–5.11)
RDW: 12.8 % (ref 11.5–15.5)
WBC: 12.3 10*3/uL — ABNORMAL HIGH (ref 4.0–10.5)

## 2015-10-03 MED ORDER — DIAZEPAM 2 MG PO TABS: 2.0000 mg | ORAL_TABLET | Freq: Two times a day (BID) | ORAL | 0 refills | Status: DC | PRN

## 2015-10-03 MED ORDER — OXYBUTYNIN CHLORIDE ER 10 MG PO TB24: 10.0000 mg | ORAL_TABLET | Freq: Every day | ORAL | 0 refills | Status: DC

## 2015-10-03 MED ORDER — PHENAZOPYRIDINE HCL 100 MG PO TABS: 100.0000 mg | ORAL_TABLET | Freq: Three times a day (TID) | ORAL | 0 refills | Status: DC | PRN

## 2015-10-03 MED ORDER — SULFAMETHOXAZOLE-TRIMETHOPRIM 800-160 MG PO TABS: 1.0000 | ORAL_TABLET | Freq: Two times a day (BID) | ORAL | 0 refills | Status: DC

## 2015-10-03 MED ORDER — OXYCODONE-ACETAMINOPHEN 5-325 MG PO TABS
1.0000 | ORAL_TABLET | ORAL | 0 refills | Status: DC | PRN
Start: 2015-10-03 — End: 2015-10-26

## 2015-10-03 NOTE — Discharge Instructions (Signed)
Ureteral Stent Implantation, Care After °Refer to this sheet in the next few weeks. These instructions provide you with information on caring for yourself after your procedure. Your health care provider may also give you more specific instructions. Your treatment has been planned according to current medical practices, but problems sometimes occur. Call your health care provider if you have any problems or questions after your procedure. °WHAT TO EXPECT AFTER THE PROCEDURE °You should be back to normal activity within 48 hours after the procedure. Nausea and vomiting may occur and are commonly the result of anesthesia. °It is common to experience sharp pain in the back or lower abdomen and penis with voiding. This is caused by movement of the ends of the stent with the act of urinating. It usually goes away within minutes after you have stopped urinating. °HOME CARE INSTRUCTIONS °Make sure to drink plenty of fluids. You may have small amounts of bleeding, causing your urine to be red. This is normal. Certain movements may trigger pain or a feeling that you need to urinate. You may be given medicines to prevent infection or bladder spasms. Be sure to take all medicines as directed. Only take over-the-counter or prescription medicines for pain, discomfort, or fever as directed by your health care provider. Do not take aspirin, as this can make bleeding worse. °Your stent will be left in until the blockage is resolved. This may take 2 weeks or longer, depending on the reason for stent implantation. You may have an X-ray exam to make sure your ureter is open and that the stent has not moved out of position (migrated). The stent can be removed by your health care provider in the office. Medicines may be given for comfort while the stent is being removed. Be sure to keep all follow-up appointments so your health care provider can check that you are healing properly. °SEEK MEDICAL CARE IF: °· You experience increasing  pain. °· Your pain medicine is not working. °SEEK IMMEDIATE MEDICAL CARE IF: °· Your urine is dark red or has blood clots. °· You are leaking urine (incontinent). °· You have a fever, chills, feeling sick to your stomach (nausea), or vomiting. °· Your pain is not relieved by pain medicine. °· The end of the stent comes out of the urethra. °· You are unable to urinate. °  °This information is not intended to replace advice given to you by your health care provider. Make sure you discuss any questions you have with your health care provider. °  °Document Released: 09/19/2012 Document Revised: 01/22/2013 Document Reviewed: 08/01/2014 °Elsevier Interactive Patient Education ©2016 Elsevier Inc. ° °

## 2015-10-06 ENCOUNTER — Encounter (HOSPITAL_COMMUNITY): Payer: Self-pay | Admitting: Urology

## 2015-10-08 ENCOUNTER — Encounter (HOSPITAL_COMMUNITY): Payer: Self-pay | Admitting: Urology

## 2015-10-12 ENCOUNTER — Other Ambulatory Visit: Payer: Self-pay | Admitting: Urology

## 2015-10-18 NOTE — Discharge Summary (Signed)
Physician Discharge Summary  Patient ID: Barton Fannyshlie Traughber MRN: [id number] DOB/AGE: 26-21-1992 26 y.o.  Admit date: 10/02/2015 Discharge date: 10/03/2015  Admission Diagnoses: Ureteral calculus Discharge Diagnoses:  Active Problems:   Ureteral calculus   Discharged Condition: good  Hospital Course: The patient tolerated the procedure well and was transferred to the floor on IV pain meds, IV fluid. On POD#1 foley was removed, pt was started on regular diet and they ambulated in the halls. Prior to discharge the pt was tolerating a regular diet, pain was controlled on PO pain meds, they were ambulating without difficulty, and they had normal bowel function.   Consults: None  Significant Diagnostic Studies: none  Treatments: surgery: stent placement  Discharge Exam: Blood pressure (!) 102/59, pulse (!) 112, temperature 100.2 F (37.9 C), temperature source Oral, resp. rate 16, height 5\' 4"  (1.626 m), weight 61 kg (134 lb 7.7 oz), last menstrual period 09/06/2015, SpO2 99 %. General appearance: alert, cooperative and appears stated age Head: Normocephalic, without obvious abnormality, atraumatic Eyes: conjunctivae/corneas clear. PERRL, EOM's intact. Fundi benign. Nose: Nares normal. Septum midline. Mucosa normal. No drainage or sinus tenderness. Cardio: regular rate and rhythm, S1, S2 normal, no murmur, click, rub or gallop GI: soft, non-tender; bowel sounds normal; no masses,  no organomegaly Extremities: extremities normal, atraumatic, no cyanosis or edema Pulses: 2+ and symmetric  Disposition: 01-Home or Self Care     Medication List    TAKE these medications   diazepam 2 MG tablet Commonly known as:  VALIUM Take 1 tablet (2 mg total) by mouth every 12 (twelve) hours as needed for muscle spasms.   oxybutynin 10 MG 24 hr tablet Commonly known as:  DITROPAN XL Take 1 tablet (10 mg total) by mouth at bedtime.   oxyCODONE-acetaminophen 5-325 MG tablet Commonly known as:   PERCOCET/ROXICET Take 1-2 tablets by mouth every 4 (four) hours as needed for moderate pain.   phenazopyridine 100 MG tablet Commonly known as:  PYRIDIUM Take 1 tablet (100 mg total) by mouth 3 (three) times daily as needed for pain.   sulfamethoxazole-trimethoprim 800-160 MG tablet Commonly known as:  BACTRIM DS,SEPTRA DS Take 1 tablet by mouth 2 (two) times daily.        SignedWilkie Aye: Allia Wiltsey 10/18/2015, 7:58 AM

## 2015-10-21 ENCOUNTER — Encounter (HOSPITAL_BASED_OUTPATIENT_CLINIC_OR_DEPARTMENT_OTHER): Payer: Self-pay | Admitting: *Deleted

## 2015-10-21 NOTE — Progress Notes (Signed)
Pt instructed npo pmn 9/24 x pain med w sip of water if needed.  [facility] @ 1015.  Labs in epic.

## 2015-10-26 ENCOUNTER — Ambulatory Visit (HOSPITAL_BASED_OUTPATIENT_CLINIC_OR_DEPARTMENT_OTHER): Payer: Managed Care, Other (non HMO) | Admitting: Anesthesiology

## 2015-10-26 ENCOUNTER — Encounter (HOSPITAL_BASED_OUTPATIENT_CLINIC_OR_DEPARTMENT_OTHER)
Admission: RE | Disposition: A | Discharge status: Home / Self Care | Payer: Self-pay | Source: Ambulatory Visit | Attending: Urology

## 2015-10-26 ENCOUNTER — Encounter (HOSPITAL_BASED_OUTPATIENT_CLINIC_OR_DEPARTMENT_OTHER): Payer: Self-pay | Admitting: *Deleted

## 2015-10-26 ENCOUNTER — Ambulatory Visit (HOSPITAL_BASED_OUTPATIENT_CLINIC_OR_DEPARTMENT_OTHER)
Admission: RE | Admit: 2015-10-26 | Discharge: 2015-10-26 | Disposition: A | Discharge status: Home / Self Care | Payer: Managed Care, Other (non HMO) | Source: Ambulatory Visit | Attending: Urology | Admitting: Urology

## 2015-10-26 DIAGNOSIS — Z87442 Personal history of urinary calculi: Secondary | ICD-10-CM | POA: Diagnosis not present

## 2015-10-26 DIAGNOSIS — N201 Calculus of ureter: Secondary | ICD-10-CM | POA: Diagnosis not present

## 2015-10-26 HISTORY — DX: Calculus of ureter: N20.1

## 2015-10-26 HISTORY — PX: CYSTOSCOPY/RETROGRADE/URETEROSCOPY/STONE EXTRACTION WITH BASKET: SHX5317

## 2015-10-26 HISTORY — PX: CYSTOSCOPY W/ URETERAL STENT PLACEMENT: SHX1429

## 2015-10-26 SURGERY — CYSTOSCOPY, WITH CALCULUS REMOVAL USING BASKET
Anesthesia: General | Site: Ureter | Laterality: Left

## 2015-10-26 MED ORDER — FENTANYL CITRATE (PF) 100 MCG/2ML IJ SOLN
INTRAMUSCULAR | Status: AC
  Filled 2015-10-26: qty 2

## 2015-10-26 MED ORDER — DEXAMETHASONE SODIUM PHOSPHATE 10 MG/ML IJ SOLN
INTRAMUSCULAR | Status: AC
  Filled 2015-10-26: qty 1

## 2015-10-26 MED ORDER — MIDAZOLAM HCL 5 MG/5ML IJ SOLN
INTRAMUSCULAR | Status: DC | PRN
  Administered 2015-10-26: 2 mg via INTRAVENOUS

## 2015-10-26 MED ORDER — LIDOCAINE 2% (20 MG/ML) 5 ML SYRINGE
INTRAMUSCULAR | Status: AC
  Filled 2015-10-26: qty 5

## 2015-10-26 MED ORDER — DOXYCYCLINE HYCLATE 50 MG PO CAPS: 50.0000 mg | ORAL_CAPSULE | Freq: Two times a day (BID) | ORAL | 0 refills | Status: DC

## 2015-10-26 MED ORDER — LACTATED RINGERS IV SOLN
INTRAVENOUS | Status: DC
  Administered 2015-10-26: 11:00:00 via INTRAVENOUS
  Filled 2015-10-26: qty 1000

## 2015-10-26 MED ORDER — OXYCODONE-ACETAMINOPHEN 5-325 MG PO TABS: 1.0000 | ORAL_TABLET | ORAL | 0 refills | Status: DC | PRN

## 2015-10-26 MED ORDER — ONDANSETRON HCL 4 MG/2ML IJ SOLN
INTRAMUSCULAR | Status: DC | PRN
  Administered 2015-10-26: 4 mg via INTRAVENOUS

## 2015-10-26 MED ORDER — DEXAMETHASONE SODIUM PHOSPHATE 4 MG/ML IJ SOLN
INTRAMUSCULAR | Status: DC | PRN
Start: 2015-10-26 — End: 2015-10-26
  Administered 2015-10-26: 10 mg via INTRAVENOUS

## 2015-10-26 MED ORDER — DEXTROSE 5 % IV SOLN
2.0000 g | INTRAVENOUS | Status: AC
  Administered 2015-10-26: 2 g via INTRAVENOUS
  Filled 2015-10-26: qty 2

## 2015-10-26 MED ORDER — IOHEXOL 300 MG/ML  SOLN
INTRAMUSCULAR | Status: DC | PRN
Start: 2015-10-26 — End: 2015-10-26
  Administered 2015-10-26: 14 mL

## 2015-10-26 MED ORDER — PROPOFOL 10 MG/ML IV BOLUS
INTRAVENOUS | Status: AC
  Filled 2015-10-26: qty 20

## 2015-10-26 MED ORDER — DEXTROSE 5 % IV SOLN
INTRAVENOUS | Status: AC
  Filled 2015-10-26: qty 50

## 2015-10-26 MED ORDER — LIDOCAINE 2% (20 MG/ML) 5 ML SYRINGE
INTRAMUSCULAR | Status: DC | PRN
  Administered 2015-10-26: 80 mg via INTRAVENOUS

## 2015-10-26 MED ORDER — KETOROLAC TROMETHAMINE 30 MG/ML IJ SOLN
INTRAMUSCULAR | Status: AC
Start: 2015-10-26 — End: 2015-10-26
  Filled 2015-10-26: qty 1

## 2015-10-26 MED ORDER — HYDROMORPHONE HCL 1 MG/ML IJ SOLN
0.2500 mg | INTRAMUSCULAR | Status: DC | PRN
  Filled 2015-10-26: qty 1

## 2015-10-26 MED ORDER — ONDANSETRON HCL 4 MG/2ML IJ SOLN
INTRAMUSCULAR | Status: AC
  Filled 2015-10-26: qty 2

## 2015-10-26 MED ORDER — PROPOFOL 10 MG/ML IV BOLUS
INTRAVENOUS | Status: DC | PRN
  Administered 2015-10-26: 160 mg via INTRAVENOUS

## 2015-10-26 MED ORDER — CEFTRIAXONE SODIUM 2 G IJ SOLR
INTRAMUSCULAR | Status: AC
  Filled 2015-10-26: qty 2

## 2015-10-26 MED ORDER — FENTANYL CITRATE (PF) 100 MCG/2ML IJ SOLN
INTRAMUSCULAR | Status: DC | PRN
  Administered 2015-10-26: 50 ug via INTRAVENOUS

## 2015-10-26 MED ORDER — MIDAZOLAM HCL 2 MG/2ML IJ SOLN
INTRAMUSCULAR | Status: AC
  Filled 2015-10-26: qty 2

## 2015-10-26 MED ORDER — SODIUM CHLORIDE 0.9 % IR SOLN
Status: DC | PRN
  Administered 2015-10-26: 4000 mL

## 2015-10-26 MED ORDER — PROPOFOL 10 MG/ML IV BOLUS
INTRAVENOUS | Status: AC
  Filled 2015-10-26: qty 40

## 2015-10-26 MED ORDER — KETOROLAC TROMETHAMINE 30 MG/ML IJ SOLN
INTRAMUSCULAR | Status: AC
  Filled 2015-10-26: qty 1

## 2015-10-26 MED ORDER — PROMETHAZINE HCL 25 MG/ML IJ SOLN
6.2500 mg | INTRAMUSCULAR | Status: DC | PRN
  Filled 2015-10-26: qty 1

## 2015-10-26 SURGICAL SUPPLY — 32 items
BAG DRAIN URO-CYSTO SKYTR STRL (DRAIN) ×4 IMPLANT
BASKET DAKOTA 1.9FR 11X120 (BASKET) IMPLANT
BASKET LASER NITINOL 1.9FR (BASKET) IMPLANT
BASKET STONE 1.7 NGAGE (UROLOGICAL SUPPLIES) ×4 IMPLANT
BASKET ZERO TIP NITINOL 2.4FR (BASKET) IMPLANT
CATH INTERMIT  6FR 70CM (CATHETERS) ×4 IMPLANT
CLOTH BEACON ORANGE TIMEOUT ST (SAFETY) ×4 IMPLANT
EVACUATOR MICROVAS BLADDER (UROLOGICAL SUPPLIES) IMPLANT
FIBER LASER FLEXIVA 1000 (UROLOGICAL SUPPLIES) IMPLANT
FIBER LASER FLEXIVA 200 (UROLOGICAL SUPPLIES) IMPLANT
FIBER LASER FLEXIVA 550 (UROLOGICAL SUPPLIES) IMPLANT
FIBER LASER TRAC TIP (UROLOGICAL SUPPLIES) IMPLANT
GLOVE BIO SURGEON STRL SZ 6.5 (GLOVE) ×3 IMPLANT
GLOVE BIO SURGEON STRL SZ8 (GLOVE) ×4 IMPLANT
GLOVE BIO SURGEONS STRL SZ 6.5 (GLOVE) ×1
GLOVE INDICATOR 6.5 STRL GRN (GLOVE) ×8 IMPLANT
GOWN STRL REUS W/ TWL LRG LVL3 (GOWN DISPOSABLE) IMPLANT
GOWN STRL REUS W/ TWL XL LVL3 (GOWN DISPOSABLE) ×2 IMPLANT
GOWN STRL REUS W/TWL LRG LVL3 (GOWN DISPOSABLE) ×4 IMPLANT
GOWN STRL REUS W/TWL XL LVL3 (GOWN DISPOSABLE) ×2
GUIDEWIRE ANG ZIPWIRE 038X150 (WIRE) ×4 IMPLANT
GUIDEWIRE STR DUAL SENSOR (WIRE) ×4 IMPLANT
IV NS IRRIG 3000ML ARTHROMATIC (IV SOLUTION) ×4 IMPLANT
KIT ROOM TURNOVER WOR (KITS) ×4 IMPLANT
MANIFOLD NEPTUNE II (INSTRUMENTS) ×4 IMPLANT
PACK CYSTO (CUSTOM PROCEDURE TRAY) ×4 IMPLANT
STENT URET 6FRX24 CONTOUR (STENTS) ×4 IMPLANT
STENT URET 6FRX26 CONTOUR (STENTS) IMPLANT
SYRINGE 10CC LL (SYRINGE) ×4 IMPLANT
TUBE CONNECTING 12'X1/4 (SUCTIONS)
TUBE CONNECTING 12X1/4 (SUCTIONS) IMPLANT
TUBE FEEDING 8FR 16IN STR KANG (MISCELLANEOUS) ×4 IMPLANT

## 2015-10-26 NOTE — H&P (View-Only) (Signed)
Urology Admission H&P  Chief Complaint: left flank pain  History of Present Illness: Alejandra Russo is a 25yo with no significant past medical history who presented to [facility] with a 1 day history of left sided abdominal and flank pain. The pain is sharp, constant, severe, nonradiating with associated nausea and vomiting. She also has urgency, dysuria. No exacerbating/alleviating events WBC count is 19 and Urine is concerning for infection. CT scan shows a 4mm left distal ureterla calculus with moderate hydronephrosis  History reviewed. No pertinent past medical history. Past Surgical History:  Procedure Laterality Date  . FOOT SURGERY    . Lapidus Fusion Right 06/13/2013   @ [facility]  . Adin Hector Bunionectomy Right 06/13/2013   @ [facility]    Home Medications:   (Not in a hospital admission) Allergies: No Known Allergies  History reviewed. No pertinent family history. Social History:  reports that she has never smoked. She has never used smokeless tobacco. She reports that she drinks alcohol. She reports that she does not use drugs.  Review of Systems  Gastrointestinal: Positive for nausea and vomiting.  Genitourinary: Positive for dysuria, flank pain, frequency and urgency.  All other systems reviewed and are negative.   Physical Exam:  Vital signs in last 24 hours: Temp:  [98.2 F (36.8 C)] 98.2 F (36.8 C) (09/01 1652) Pulse Rate:  [98-118] 98 (09/01 1857) Resp:  [18-20] 18 (09/01 1857) BP: (103-106)/(69-95) 103/69 (09/01 1857) SpO2:  [100 %] 100 % (09/01 1857) Weight:  [62.1 kg (137 lb)] 62.1 kg (137 lb) (09/01 1652) Physical Exam  Constitutional: She is oriented to person, place, and time. She appears well-developed and well-nourished.  HENT:  Head: Normocephalic and atraumatic.  Eyes: EOM are normal. Pupils are equal, round, and reactive to light.  Neck: Normal range of motion. No thyromegaly present.  Cardiovascular: Normal rate and regular rhythm.   Respiratory:  Effort normal. No respiratory distress.  GI: Soft. She exhibits no distension and no mass. There is no tenderness. There is no rebound and no guarding.  Musculoskeletal: Normal range of motion. She exhibits no edema.  Neurological: She is alert and oriented to person, place, and time.  Skin: Skin is warm and dry.  Psychiatric: She has a normal mood and affect. Her behavior is normal. Judgment and thought content normal.    Laboratory Data:  Results for orders placed or performed during the hospital encounter of 10/02/15 (from the past 24 hour(s))  Pregnancy, urine     Status: None   Collection Time: 10/02/15  5:20 PM  Result Value Ref Range   Preg Test, Ur NEGATIVE NEGATIVE  Urinalysis, Routine w reflex microscopic (not at [facility] [place] [facility])     Status: Abnormal   Collection Time: 10/02/15  5:20 PM  Result Value Ref Range   Color, Urine AMBER (A) YELLOW   APPearance CLOUDY (A) CLEAR   Specific Gravity, Urine 1.019 1.005 - 1.030   pH 6.0 5.0 - 8.0   Glucose, UA NEGATIVE NEGATIVE mg/dL   Hgb urine dipstick LARGE (A) NEGATIVE   Bilirubin Urine NEGATIVE NEGATIVE   Ketones, ur 40 (A) NEGATIVE mg/dL   Protein, ur 30 (A) NEGATIVE mg/dL   Nitrite POSITIVE (A) NEGATIVE   Leukocytes, UA LARGE (A) NEGATIVE  Urine microscopic-add on     Status: Abnormal   Collection Time: 10/02/15  5:20 PM  Result Value Ref Range   Squamous Epithelial / LPF 0-5 (A) NONE SEEN   WBC, UA 6-30 0 - 5 WBC/hpf  RBC / HPF TOO NUMEROUS TO COUNT 0 - 5 RBC/hpf   Bacteria, UA MANY (A) NONE SEEN   Urine-Other MUCOUS PRESENT   Comprehensive metabolic panel     Status: Abnormal   Collection Time: 10/02/15  5:30 PM  Result Value Ref Range   Sodium 137 135 - 145 mmol/L   Potassium 3.3 (L) 3.5 - 5.1 mmol/L   Chloride 105 101 - 111 mmol/L   CO2 21 (L) 22 - 32 mmol/L   Glucose, Bld 97 65 - 99 mg/dL   BUN 12 6 - 20 mg/dL   Creatinine, Ser 6.040.67 0.44 - 1.00 mg/dL   Calcium 9.1 8.9 - 54.010.3 mg/dL   Total Protein 7.4 6.5 - 8.1 g/dL    Albumin 4.6 3.5 - 5.0 g/dL   AST 20 15 - 41 U/L   ALT 17 14 - 54 U/L   Alkaline Phosphatase 47 38 - 126 U/L   Total Bilirubin 1.3 (H) 0.3 - 1.2 mg/dL   GFR calc non Af Amer >60 >60 mL/min   GFR calc Af Amer >60 >60 mL/min   Anion gap 11 5 - 15  CBC with Differential     Status: Abnormal   Collection Time: 10/02/15  5:30 PM  Result Value Ref Range   WBC 18.9 (H) 4.0 - 10.5 K/uL   RBC 4.02 3.87 - 5.11 MIL/uL   Hemoglobin 11.2 (L) 12.0 - 15.0 g/dL   HCT 98.133.7 (L) 19.136.0 - 47.846.0 %   MCV 83.8 78.0 - 100.0 fL   MCH 27.9 26.0 - 34.0 pg   MCHC 33.2 30.0 - 36.0 g/dL   RDW 29.512.1 62.111.5 - 30.815.5 %   Platelets 182 150 - 400 K/uL   Neutrophils Relative % 92 %   Neutro Abs 17.5 (H) 1.7 - 7.7 K/uL   Lymphocytes Relative 2 %   Lymphs Abs 0.4 (L) 0.7 - 4.0 K/uL   Monocytes Relative 6 %   Monocytes Absolute 1.1 (H) 0.1 - 1.0 K/uL   Eosinophils Relative 0 %   Eosinophils Absolute 0.0 0.0 - 0.7 K/uL   Basophils Relative 0 %   Basophils Absolute 0.0 0.0 - 0.1 K/uL   No results found for this or any previous visit (from the past 240 hour(s)). Creatinine:  Recent Labs  10/02/15 1730  CREATININE 0.67   Baseline Creatinine: 0.7  Impression/Assessment:  25yo with left ureteral calculus, sepsis from a urinary source  Plan:  1. The risks/benefits/alternatives to left ureteral stent placement was explained to the patient and she understands and wishes to proceed with surgery 2. She will be admitted for IV antibiotics and CV monitoring post op.   Wilkie Ayeatrick Zacherie Honeyman 10/02/2015, 8:36 PM

## 2015-10-26 NOTE — Interval H&P Note (Signed)
History and Physical Interval Note:  10/26/2015 11:48 AM  Alejandra Russo  has presented today for surgery, with the diagnosis of LEFT URETERAL CALCULUS  The various methods of treatment have been discussed with the patient and family. After consideration of risks, benefits and other options for treatment, the patient has consented to  Procedure(s): CYSTOSCOPY/RETROGRADE/URETEROSCOPY/STONE EXTRACTION WITH BASKET (Left) CYSTOSCOPY WITH STENT REPLACEMENT (Left) HOLMIUM LASER APPLICATION (Left) as a surgical intervention .  The patient's history has been reviewed, patient examined, no change in status, stable for surgery.  I have reviewed the patient's chart and labs.  Questions were answered to the patient's satisfaction.     Wilkie AyePatrick Adisyn Ruscitti

## 2015-10-26 NOTE — Discharge Instructions (Signed)
Ureteroscopy, Care After °Refer to this sheet in the next few weeks. These instructions provide you with information on caring for yourself after your procedure. Your health care provider may also give you more specific instructions. Your treatment has been planned according to current medical practices, but problems sometimes occur. Call your health care provider if you have any problems or questions after your procedure.  °WHAT TO EXPECT AFTER THE PROCEDURE  °After your procedure, it is typical to have the following:  °· A burning sensation when you urinate. °· Blood in your urine. °HOME CARE INSTRUCTIONS  °· Only take medicines as directed by your health care provider. Do not take any over-the-counter pain medication unless your health care provider says it is okay. °· Take a warm bath or hold a warm washcloth over your groin to relieve burning. °· Drink enough fluids to keep your urine clear or pale yellow. °¨ Drink two 8-ounce glasses of water every hour for the first 2 hours after you get home. °¨ Continue to drink water often at home. °· You can eat what you usually do. °· Ask your surgeon when you can do your usual activities. °· If you had a tube placed to keep urine flowing (ureteral stent), ask your health care provider when you need to return to have it removed. °· Keep all follow-up appointments. °SEEK MEDICAL CARE IF:  °· You have chills or fever. °· You have burning pain for longer than 24 hours after the procedure. °· You have blood in your urine for longer than 24 hours after the procedure. °SEEK IMMEDIATE MEDICAL CARE IF:  °· You have large amounts of blood or clots in your urine. °· You have very bad pain. °· You have chest pain or trouble breathing. °  °This information is not intended to replace advice given to you by your health care provider. Make sure you discuss any questions you have with your health care provider. °  °Document Released: 01/22/2013 Document Reviewed: 01/22/2013 °Elsevier  Interactive Patient Education ©2016 Elsevier Inc. ° ° °Post Anesthesia Home Care Instructions ° °Activity: °Get plenty of rest for the remainder of the day. A responsible adult should stay with you for 24 hours following the procedure.  °For the next 24 hours, DO NOT: °-Drive a car °-Operate machinery °-Drink alcoholic beverages °-Take any medication unless instructed by your physician °-Make any legal decisions or sign important papers. ° °Meals: °Start with liquid foods such as gelatin or soup. Progress to regular foods as tolerated. Avoid greasy, spicy, heavy foods. If nausea and/or vomiting occur, drink only clear liquids until the nausea and/or vomiting subsides. Call your physician if vomiting continues. ° °Special Instructions/Symptoms: °Your throat may feel dry or sore from the anesthesia or the breathing tube placed in your throat during surgery. If this causes discomfort, gargle with warm salt water. The discomfort should disappear within 24 hours. ° °If you had a scopolamine patch placed behind your ear for the management of post- operative nausea and/or vomiting: ° °1. The medication in the patch is effective for 72 hours, after which it should be removed.  Wrap patch in a tissue and discard in the trash. Wash hands thoroughly with soap and water. °2. You may remove the patch earlier than 72 hours if you experience unpleasant side effects which may include dry mouth, dizziness or visual disturbances. °3. Avoid touching the patch. Wash your hands with soap and water after contact with the patch. °  ° °

## 2015-10-26 NOTE — Anesthesia Procedure Notes (Signed)
Procedure Name: LMA Insertion Date/Time: 10/26/2015 11:55 AM Performed by: Tyrone NineSAUVE, Liliyana Thobe F Pre-anesthesia Checklist: Patient identified, Timeout performed, Emergency Drugs available, Suction available and Patient being monitored Patient Re-evaluated:Patient Re-evaluated prior to inductionOxygen Delivery Method: Circle system utilized Preoxygenation: Pre-oxygenation with 100% oxygen Intubation Type: IV induction Ventilation: Mask ventilation without difficulty LMA: LMA inserted LMA Size: 4.0 Number of attempts: 1 Placement Confirmation: positive ETCO2 and breath sounds checked- equal and bilateral Tube secured with: Tape Dental Injury: Teeth and Oropharynx as per pre-operative assessment

## 2015-10-26 NOTE — Transfer of Care (Signed)
Immediate Anesthesia Transfer of Care Note  Patient: Alejandra Russo  Procedure(s) Performed: Procedure(s): CYSTOSCOPY/RETROGRADE/URETEROSCOPY/STONE EXTRACTION WITH BASKET (Left) CYSTOSCOPY WITH STENT REPLACEMENT (Left)  Patient Location: PACU  Anesthesia Type:General  Level of Consciousness: awake, alert , oriented and patient cooperative  Airway & Oxygen Therapy: Patient Spontanous Breathing and Patient connected to nasal cannula oxygen  Post-op Assessment: Report given to RN and Post -op Vital signs reviewed and stable  Post vital signs: Reviewed and stable  Last Vitals:  Vitals:   10/26/15 0945 10/26/15 1245  BP: (!) 111/54 (P) 102/73  Pulse: 77   Resp: 16   Temp: 36.8 C (P) 36.6 C    Last Pain:  Vitals:   10/26/15 0945  TempSrc: Oral      Patients Stated Pain Goal: 8 (10/26/15 1016)  Complications: No apparent anesthesia complications

## 2015-10-26 NOTE — Brief Op Note (Signed)
10/26/2015  12:33 PM  PATIENT:  Alejandra Russo  25 y.o. female  PRE-OPERATIVE DIAGNOSIS:  LEFT URETERAL CALCULUS  POST-OPERATIVE DIAGNOSIS:  LEFT URETERAL CALCULUS  PROCEDURE:  Procedure(s): CYSTOSCOPY/RETROGRADE/URETEROSCOPY/STONE EXTRACTION WITH BASKET (Left) CYSTOSCOPY WITH STENT REPLACEMENT (Left)  SURGEON:  Surgeon(s) and Role:    * Malen GauzePatrick L McKenzie, MD - Primary  PHYSICIAN ASSISTANT:   ASSISTANTS: none   ANESTHESIA:   general  EBL:  No intake/output data recorded.  BLOOD ADMINISTERED:none  DRAINS: left 6x24 JJ stent with tether   LOCAL MEDICATIONS USED:  NONE  SPECIMEN:  Source of Specimen:  left ureteral and renal calculi  DISPOSITION OF SPECIMEN:  N/A  COUNTS:  YES  TOURNIQUET:  * No tourniquets in log *  DICTATION: .Note written in EPIC  PLAN OF CARE: Discharge to home after PACU  PATIENT DISPOSITION:  PACU - hemodynamically stable.   Delay start of Pharmacological VTE agent (>24hrs) due to surgical blood loss or risk of bleeding: not applicable

## 2015-10-26 NOTE — Anesthesia Preprocedure Evaluation (Addendum)
Anesthesia Evaluation  Patient identified by MRN, date of birth, ID band Patient awake    Reviewed: Allergy & Precautions, NPO status , Patient's Chart, lab work & pertinent test results  Airway Mallampati: I  TM Distance: >3 FB     Dental  (+) Teeth Intact   Pulmonary neg pulmonary ROS,    breath sounds clear to auscultation       Cardiovascular negative cardio ROS   Rhythm:Regular Rate:Normal     Neuro/Psych negative neurological ROS     GI/Hepatic negative GI ROS, Neg liver ROS,   Endo/Other  negative endocrine ROS  Renal/GU Per surgeon     Musculoskeletal Ankle/foot issues   Abdominal   Peds  Hematology negative hematology ROS (+)   Anesthesia Other Findings   Reproductive/Obstetrics                             Anesthesia Physical Anesthesia Plan  ASA: I  Anesthesia Plan: General   Post-op Pain Management:    Induction: Intravenous  Airway Management Planned: LMA  Additional Equipment:   Intra-op Plan:   Post-operative Plan: Extubation in OR  Informed Consent: I have reviewed the patients History and Physical, chart, labs and discussed the procedure including the risks, benefits and alternatives for the proposed anesthesia with the patient or authorized representative who has indicated his/her understanding and acceptance.   Dental advisory given  Plan Discussed with: CRNA  Anesthesia Plan Comments:         Anesthesia Quick Evaluation

## 2015-10-27 ENCOUNTER — Encounter (HOSPITAL_BASED_OUTPATIENT_CLINIC_OR_DEPARTMENT_OTHER): Payer: Self-pay | Admitting: Urology

## 2015-10-29 NOTE — Anesthesia Postprocedure Evaluation (Signed)
Anesthesia Post Note  Patient: Alejandra Russo  Procedure(s) Performed: Procedure(s) (LRB): CYSTOSCOPY/RETROGRADE/URETEROSCOPY/STONE EXTRACTION WITH BASKET (Left) CYSTOSCOPY WITH STENT REPLACEMENT (Left)  Patient location during evaluation: PACU Anesthesia Type: General Level of consciousness: awake and alert Pain management: pain level controlled Vital Signs Assessment: post-procedure vital signs reviewed and stable Respiratory status: spontaneous breathing, nonlabored ventilation, respiratory function stable and patient connected to nasal cannula oxygen Cardiovascular status: blood pressure returned to baseline and stable Postop Assessment: no signs of nausea or vomiting Anesthetic complications: no    Last Vitals:  Vitals:   10/26/15 1315 10/26/15 1326  BP: 96/67 (!) 100/59  Pulse: 74 (!) 59  Resp: 15 11  Temp:      Last Pain:  Vitals:   10/27/15 1034  TempSrc:   PainSc: 3                  Jazzalynn Rhudy,JAMES TERRILL

## 2015-10-30 NOTE — Op Note (Signed)
.  Preoperative diagnosis: Left ureteral stone  Postoperative diagnosis: Same  Procedure: 1 cystoscopy 2. Left retrograde pyelography 3.  Intraoperative fluoroscopy, under one hour, with interpretation 4.  Left ureteroscopic stone manipulation with basket extraction 5.  Left 6 x 24 JJ stent  exchange  Attending: Cleda MccreedyPatrick Mackenzie  Anesthesia: General  Estimated blood loss: None  Drains: Left 6 x 24 JJ ureteral stent with tether  Specimens: stone for analysis  Antibiotics: rocephin  Findings: left distal ureteral stone. no hydronephrosis. No masses/lesions in the bladder. Ureteral orifices in normal anatomic location. Multiple 1-613mm lower pole calculi  Indications: Patient is a 25 year old female with a history of left renal and ureteral stone and who is status post stent placement.  After discussing treatment options, she decided proceed with left ureteroscopic stone manipulation.  Procedure her in detail: The patient was brought to the operating room and a brief timeout was done to ensure correct patient, correct procedure, correct site.  General anesthesia was administered patient was placed in dorsal lithotomy position.  Her genitalia was then prepped and draped in usual sterile fashion.  A rigid 22 French cystoscope was passed in the urethra and the bladder.  Bladder was inspected free masses or lesions.  the ureteral orifices were in the normal orthotopic locations. Using a grasper the left ureteral stent was brought to tpo the urethral meatus. A zipwire was advanced through the stent and up to the renal pelvis. The stent was then removed.  a 6 french ureteral catheter was then instilled into the left ureteral orifice.  a gentle retrograde was obtained and findings noted above.  we then placed a zip wire through the ureteral catheter and advanced up to the renal pelvis.  we then removed the cystoscope and cannulated the left ureteral orifice with a semirigid ureteroscope.  We located a  stone in the distal ureteral and using a Ngage basket the stone was removed. Once we reached the UPJ a sensor wire was advanced in to the renal pelvis. We then removed the ureteroscope and advanced a flexible ureteroscope over the sensor wire. We encountered stones in the lower pole.   The stones were then removed with a engage basket.  We then placed and good coil was noted in the the renal pelvis under fluoroscopy and the bladder under direct vision.   once all stone fragments were removed we then placed a 6 x 24 double-j ureteral stent over the original zip wire.  the stone fragments were then removed from the bladder and sent for analysis.   the bladder was then drained and this concluded the procedure which was well tolerated by patient.  Complications: None  Condition: Stable, extubated, transferred to PACU  Plan: Patient is to be discharged home as to follow-up in 2 weeks. She is to remove her stent in 72 hours by pulling the tether

## 2016-05-24 ENCOUNTER — Encounter: Payer: Self-pay | Admitting: Podiatry

## 2016-05-24 ENCOUNTER — Ambulatory Visit (INDEPENDENT_AMBULATORY_CARE_PROVIDER_SITE_OTHER): Payer: Managed Care, Other (non HMO) | Admitting: Podiatry

## 2016-05-24 VITALS — Ht 64.0 in | Wt 137.0 lb

## 2016-05-24 DIAGNOSIS — Z9889 Other specified postprocedural states: Secondary | ICD-10-CM | POA: Diagnosis not present

## 2016-05-24 DIAGNOSIS — M2012 Hallux valgus (acquired), left foot: Secondary | ICD-10-CM | POA: Diagnosis not present

## 2016-05-24 DIAGNOSIS — M21962 Unspecified acquired deformity of left lower leg: Secondary | ICD-10-CM | POA: Diagnosis not present

## 2016-05-24 NOTE — Patient Instructions (Signed)
Seen for evaluation of foot condition prior to entering [employer] service. Noted of no contra indication for the proposed activity. Letter written to the chief medical officer for her acceptable foot condition.

## 2016-05-24 NOTE — Progress Notes (Signed)
Subjective: 26 year old female presents requesting a letter that describes her current foot condition to her chief medical officer in [employer] where she is applying for enrollment.  Stated that she is currently employed and has had no problem with her feet since her last right foot surgery in 2015. She is active with her exercise activity that includes jogging.   S/P Right Lapidus fusion with McBride right 06/13/2013.  Review of Systems - General ROS: negative for - chills, fatigue, night sweats or sleep disturbance Ophthalmic ROS: negative ENT ROS: negative Allergy and Immunology ROS: negative Breast ROS: negative for breast lumps Respiratory ROS: no cough, shortness of breath, or wheezing Cardiovascular ROS: no chest pain or dyspnea on exertion Gastrointestinal ROS: no abdominal pain, change in bowel habits, or black or bloody stools Genito-Urinary ROS: no dysuria, trouble voiding, or hematuria Musculoskeletal ROS: negative Neurological ROS: no TIA or stroke symptoms Dermatological ROS: negative.  Objective: Dermatologic: No abnormal skin lesions. Normal post surgical scar on right foot over the first metatarsal head and base area. Vascular: All pedal pulses are palpable. No edema or erythema noted. Neurologic: All epicritic and tactile sensations grossly intact.  Orthopedic: Post surgical corrected and stable first ray with good plantar flexion, and shortened first ray right. 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 Hypermobility of the first ray left. Pes planus with weight bearing left.  Assessment: Post surgical corrected right first ray with stable plantar flexion and shorted first ray. Subluxed first MPJ with severe bunion and hypermobile first ray left.  Plan: Noted of no acute distress with her feet and do not anticipate any disability that might compromise her future service in [employer]. A letter to her chief medical officer indicating the above dispensed to the  patient.

## 2017-08-16 IMAGING — CT CT RENAL STONE PROTOCOL
2 of 4 series · 17 of 46 positions shown, 19 images · non-contrast
Comparison: None.

CLINICAL DATA: 25-year-old female with acute left flank and
abdominal pain.

EXAM:
CT ABDOMEN AND PELVIS WITHOUT CONTRAST
TECHNIQUE: Multidetector CT imaging of the abdomen and pelvis was performed
following the standard protocol without IV contrast.

[Series 2: axial st · axial · 0.71mm/px · z∈[+495,+885]mm · 14 of 86 slices shown, 16 images]
[im 4/86  soft-tissue]
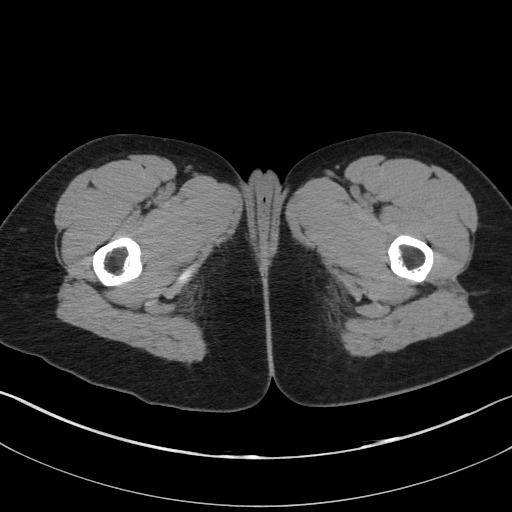
[im 4/86  bone]
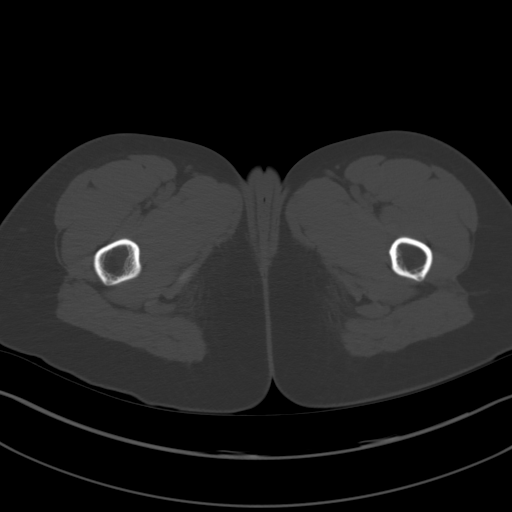
[im 12/86  soft-tissue]
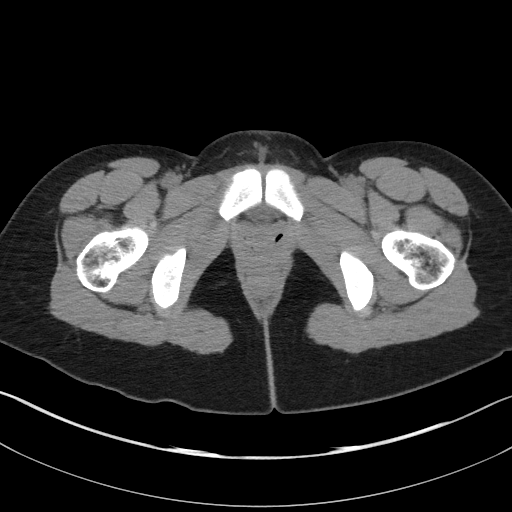
[im 15/86  soft-tissue]
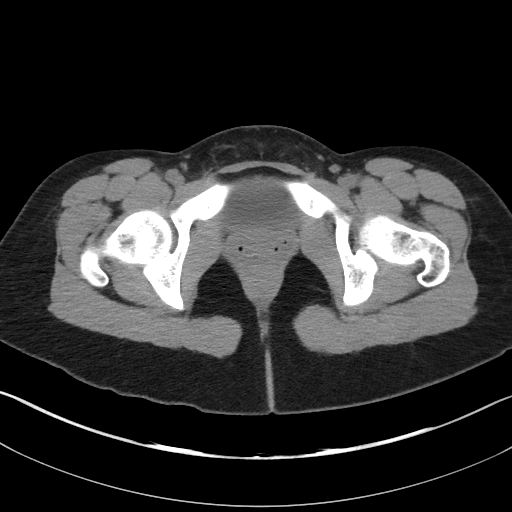
[im 23/86  soft-tissue]
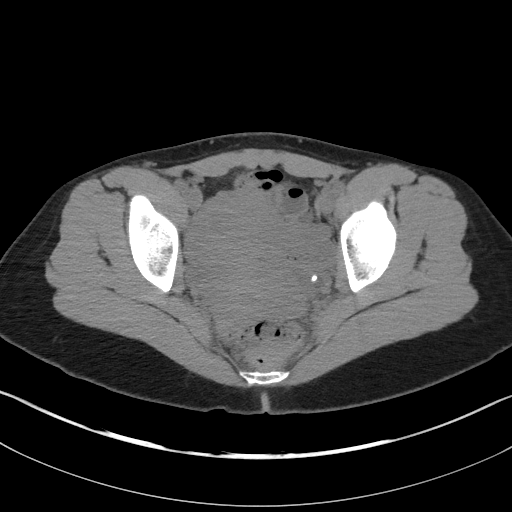
[im 30/86  soft-tissue]
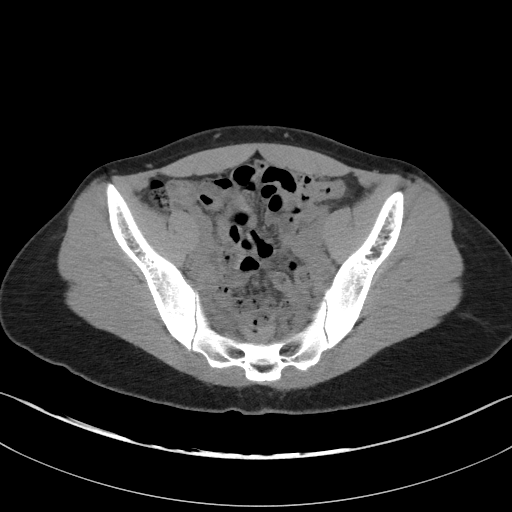
[im 34/86  soft-tissue]
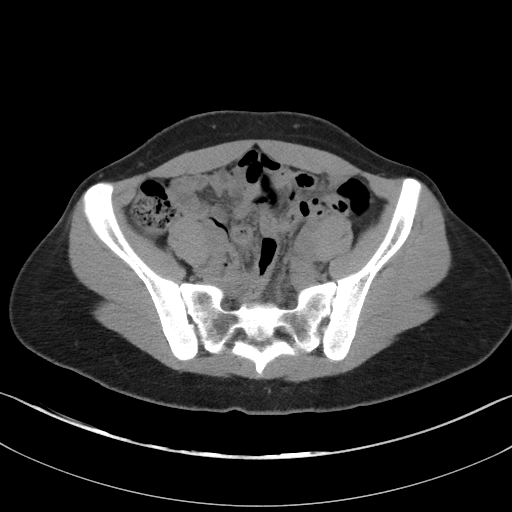
[im 41/86  soft-tissue]
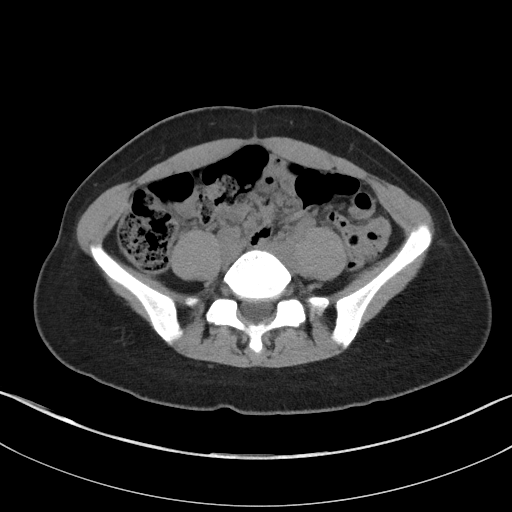
[im 45/86  soft-tissue]
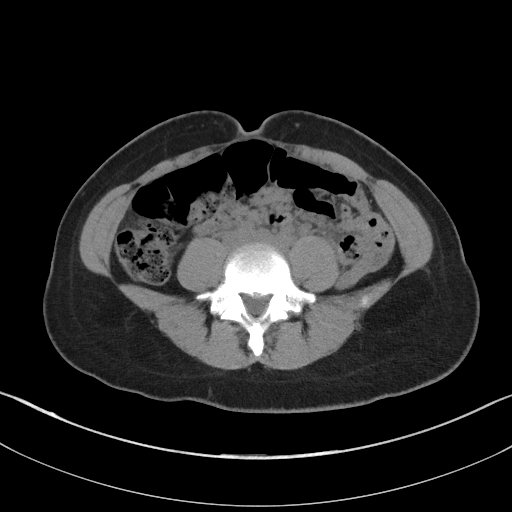
[im 52/86  soft-tissue]
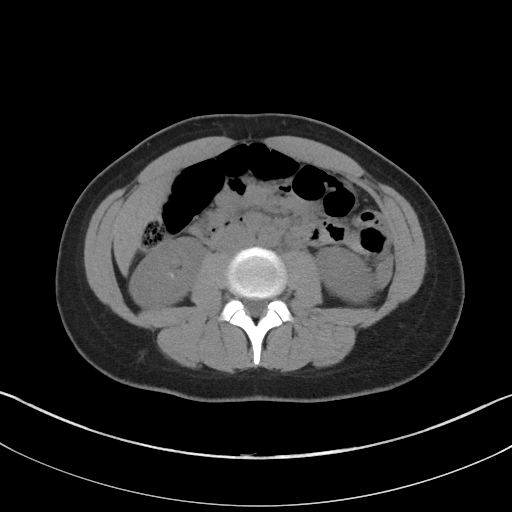
[im 52/86  bone]
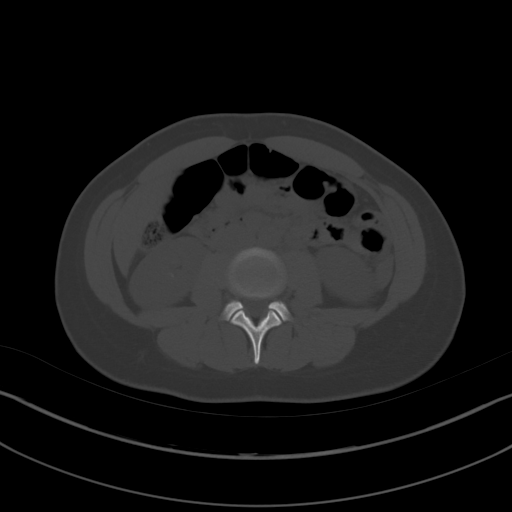
[im 56/86  soft-tissue]
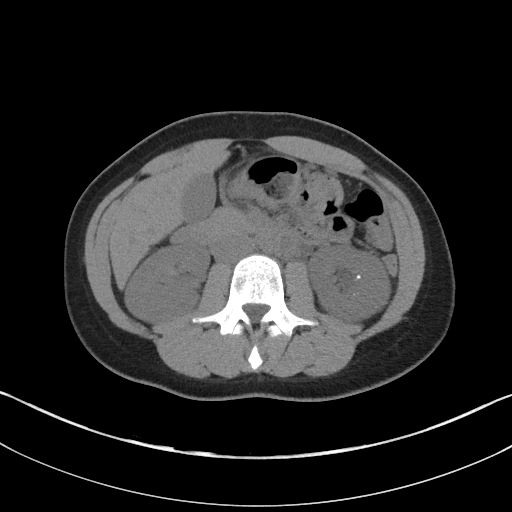
[im 63/86  soft-tissue]
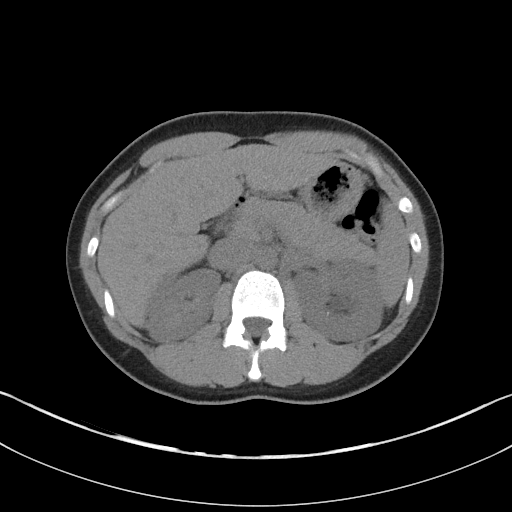
[im 71/86  soft-tissue]
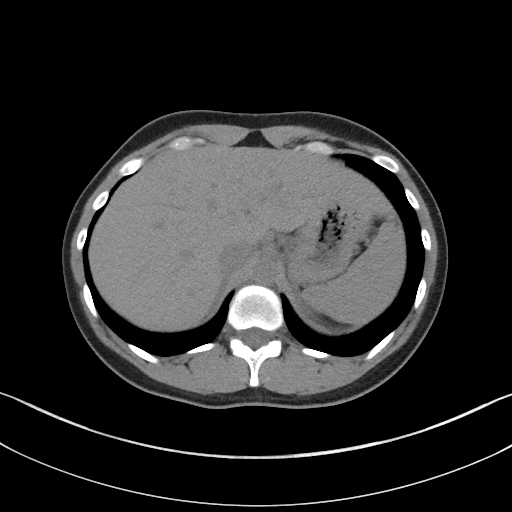
[im 74/86  soft-tissue]
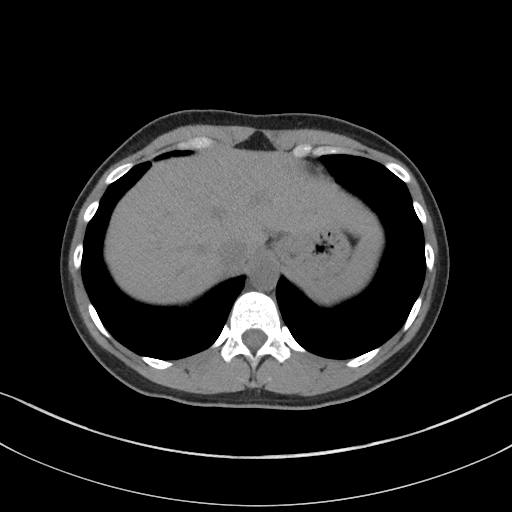
[im 82/86  soft-tissue]
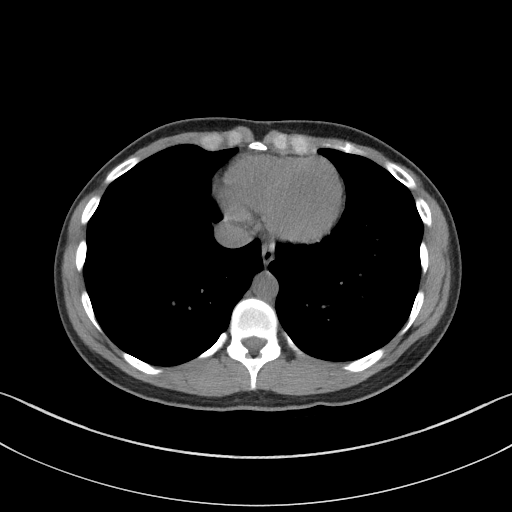

[Series 4: coronal st · coronal · 0.85mm/px · 3 of 66 slices shown]
[im 22/66  soft-tissue]
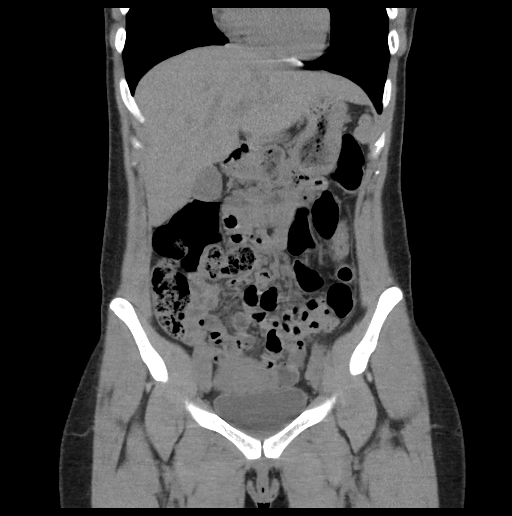
[im 29/66  soft-tissue]
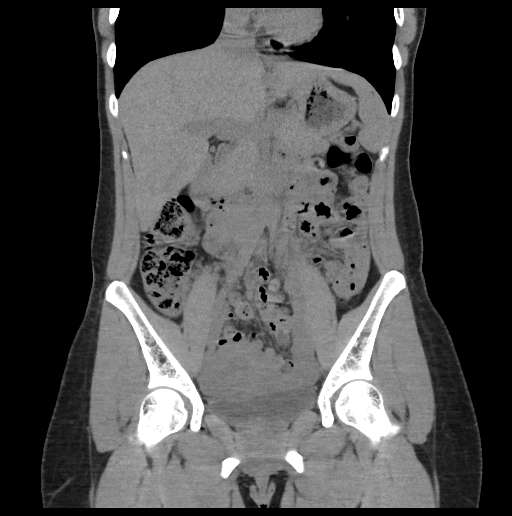
[im 37/66  soft-tissue]
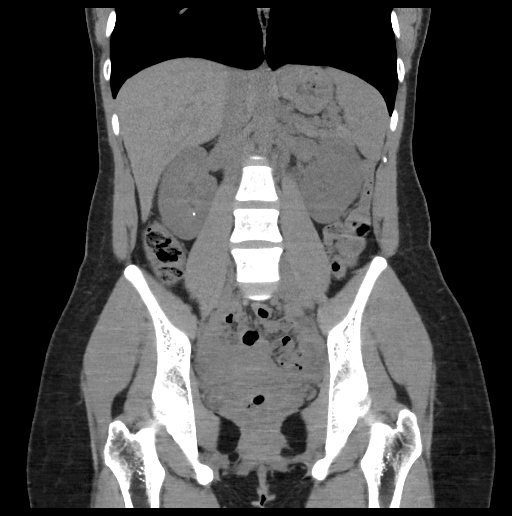

[17 of 46 positions shown; findings below may reference images not displayed]

FINDINGS: Please note that parenchymal abnormalities may be missed without
intravenous contrast.

Lower chest:  Unremarkable

Hepatobiliary: The liver and gallbladder are unremarkable. There is
no evidence of biliary dilatation.

Pancreas: Unremarkable

Spleen: Unremarkable

Adrenals/Urinary Tract: Mild left hydroureteronephrosis noted into
the pelvis. A 4 mm calcification in the region the distal left
ureter probably represents a distal ureteral calculus. Multiple
bilateral nonobstructing renal calculi are identified. The adrenal
glands and bladder are unremarkable.

Stomach/Bowel: Unremarkable. No evidence of bowel obstruction. The
appendix is normal.

Vascular/Lymphatic: Unremarkable. No enlarged lymph nodes or
abdominal aortic aneurysm.

Reproductive: Unremarkable

Other: No free fluid or pneumoperitoneum.

Musculoskeletal: No acute or suspicious abnormalities.
IMPRESSION: Mild left hydronephrosis from probable 4 mm distal left ureteral
calculus.

Multiple bilateral nonobstructing renal calculi.

## 2018-01-31 NOTE — L&D Delivery Note (Signed)
Delivery Note  First Stage: Induction of labor: 12/17 @ midnight Labor onset: 12/17 @ 0600 Augmentation: AROM Analgesia /Anesthesia intrapartum: epidural  AROM at 1015 - scant clear fluid  Second Stage: Complete dilation at 1340 Onset of pushing at 1344 FHR second stage Category 2   Delivery of a viable, healthy female "Jolie" at 16 by Lars Pinks, CNM in LOA position with compound left hand presentation. Loop of umbilical cord at fetal chest Cord double clamped after cessation of pulsation, cut by FOB Cord blood sample collected   Third Stage: Placenta delivered via Delena Bali intact with 3 VC @ 1402  Placenta disposition: hospital disposal  Uterine tone firm / bleeding - moderate, several small clots expressed from lower uterine segment   1st degree perineal and right periurethral laceration identified - Betadine prep performed, red rubber urethral catheter placed for repair. Dr. Benjie Karvonen called to perform repair.  She placed 2 interrupted sutures for hemostasis then repaired periurethral laceration in standard fashion. Urethral meatus intact with good hemostasis after repair. 1st degree was then repaired in standard fashion Anesthesia for repair: epidural  Repair: 3.0 and 4.0 vicryl  Est. Blood Loss (mL): 109 mL  Complications: none  Mom to postpartum.  Baby to Couplet care / Skin to Skin.  Newborn: Birth Weight: 6#14oz (3118g) Apgar Scores: 8, 9 Feeding planned: Breast  Lars Pinks, MSN, CNM [facility]

## 2018-06-11 LAB — OB RESULTS CONSOLE RPR: RPR: NONREACTIVE

## 2018-06-11 LAB — OB RESULTS CONSOLE GC/CHLAMYDIA
Chlamydia: NEGATIVE
Gonorrhea: NEGATIVE

## 2018-06-11 LAB — OB RESULTS CONSOLE ABO/RH: RH Type: POSITIVE

## 2018-06-11 LAB — OB RESULTS CONSOLE HEPATITIS B SURFACE ANTIGEN: Hepatitis B Surface Ag: NEGATIVE

## 2018-06-11 LAB — OB RESULTS CONSOLE RUBELLA ANTIBODY, IGM: Rubella: IMMUNE

## 2018-06-11 LAB — OB RESULTS CONSOLE HIV ANTIBODY (ROUTINE TESTING): HIV: NONREACTIVE

## 2018-06-11 LAB — OB RESULTS CONSOLE ANTIBODY SCREEN: Antibody Screen: NEGATIVE

## 2018-12-12 ENCOUNTER — Other Ambulatory Visit: Payer: Self-pay | Admitting: Obstetrics and Gynecology

## 2018-12-20 ENCOUNTER — Ambulatory Visit (HOSPITAL_COMMUNITY)
Admission: RE | Admit: 2018-12-20 | Discharge: 2018-12-20 | Disposition: A | Discharge status: Home / Self Care | Payer: 59 | Source: Ambulatory Visit | Attending: Internal Medicine | Admitting: Internal Medicine

## 2018-12-20 ENCOUNTER — Other Ambulatory Visit: Payer: Self-pay

## 2018-12-20 DIAGNOSIS — Z3A00 Weeks of gestation of pregnancy not specified: Secondary | ICD-10-CM | POA: Insufficient documentation

## 2018-12-20 DIAGNOSIS — O99019 Anemia complicating pregnancy, unspecified trimester: Secondary | ICD-10-CM | POA: Diagnosis present

## 2018-12-20 DIAGNOSIS — D649 Anemia, unspecified: Secondary | ICD-10-CM | POA: Insufficient documentation

## 2018-12-20 MED ORDER — SODIUM CHLORIDE 0.9 % IV SOLN
INTRAVENOUS | Status: DC | PRN
  Administered 2018-12-20: 250 mL via INTRAVENOUS

## 2018-12-20 MED ORDER — SODIUM CHLORIDE 0.9 % IV SOLN
510.0000 mg | INTRAVENOUS | Status: DC
  Administered 2018-12-20: 510 mg via INTRAVENOUS
  Filled 2018-12-20: qty 17

## 2018-12-20 NOTE — Discharge Instructions (Signed)

## 2018-12-20 NOTE — Progress Notes (Signed)
Patient received Feraheme via PIV. Observed for at least 30 minutes post infusion.Tolerated well, vitals stable, discharge instructions given, verbalized understanding. Patient alert, oriented and ambulatory at the time of discharge.  

## 2018-12-31 ENCOUNTER — Other Ambulatory Visit: Payer: Self-pay

## 2018-12-31 ENCOUNTER — Ambulatory Visit (HOSPITAL_COMMUNITY)
Admission: RE | Admit: 2018-12-31 | Discharge: 2018-12-31 | Disposition: A | Discharge status: Home / Self Care | Payer: 59 | Source: Ambulatory Visit | Attending: Critical Care Medicine | Admitting: Critical Care Medicine

## 2018-12-31 DIAGNOSIS — O99013 Anemia complicating pregnancy, third trimester: Secondary | ICD-10-CM | POA: Insufficient documentation

## 2018-12-31 MED ORDER — FERUMOXYTOL INJECTION 510 MG/17 ML
510.0000 mg | Freq: Once | INTRAVENOUS | Status: AC
  Administered 2018-12-31: 510 mg via INTRAVENOUS
  Filled 2018-12-31 (×2): qty 17

## 2018-12-31 MED ORDER — SODIUM CHLORIDE 0.9 % IV SOLN
INTRAVENOUS | Status: DC | PRN
  Administered 2018-12-31: 250 mL via INTRAVENOUS

## 2018-12-31 NOTE — Discharge Instructions (Signed)

## 2018-12-31 NOTE — Progress Notes (Signed)
Patient received IV Feraheme as ordered by Tiana Loft MD. Observed for at least 30 minutes post infusion.Tolerated well, vitals stable, discharge instructions given, verbalized understanding. Patient alert, oriented and ambulatory at the time of discharge.

## 2019-01-01 LAB — OB RESULTS CONSOLE GBS: GBS: POSITIVE

## 2019-01-15 ENCOUNTER — Telehealth (HOSPITAL_COMMUNITY): Payer: Self-pay | Admitting: *Deleted

## 2019-01-15 ENCOUNTER — Encounter (HOSPITAL_COMMUNITY): Payer: Self-pay | Admitting: *Deleted

## 2019-01-15 NOTE — Telephone Encounter (Signed)
Preadmission screen  

## 2019-01-16 ENCOUNTER — Other Ambulatory Visit: Payer: Self-pay

## 2019-01-16 ENCOUNTER — Other Ambulatory Visit: Payer: Self-pay | Admitting: Obstetrics and Gynecology

## 2019-01-16 ENCOUNTER — Other Ambulatory Visit (HOSPITAL_COMMUNITY)
Admission: RE | Admit: 2019-01-16 | Discharge: 2019-01-16 | Disposition: A | Discharge status: Home / Self Care | Payer: 59 | Source: Ambulatory Visit | Attending: Obstetrics and Gynecology | Admitting: Obstetrics and Gynecology

## 2019-01-16 ENCOUNTER — Other Ambulatory Visit (HOSPITAL_COMMUNITY): Admission: RE | Admit: 2019-01-16 | Payer: 59 | Source: Ambulatory Visit

## 2019-01-16 LAB — SARS CORONAVIRUS 2 (TAT 6-24 HRS): SARS Coronavirus 2: NEGATIVE

## 2019-01-16 NOTE — MAU Note (Signed)
Asymptomatic, swab collected. 

## 2019-01-17 ENCOUNTER — Inpatient Hospital Stay (HOSPITAL_COMMUNITY): Payer: 59 | Admitting: Anesthesiology

## 2019-01-17 ENCOUNTER — Inpatient Hospital Stay (HOSPITAL_COMMUNITY): Payer: 59

## 2019-01-17 ENCOUNTER — Inpatient Hospital Stay (HOSPITAL_COMMUNITY)
Admission: AD | Admit: 2019-01-17 | Discharge: 2019-01-18 | DRG: 807 | Disposition: A | Discharge status: Home / Self Care | Payer: 59 | Attending: Obstetrics and Gynecology | Admitting: Obstetrics and Gynecology

## 2019-01-17 ENCOUNTER — Encounter (HOSPITAL_COMMUNITY): Payer: Self-pay | Admitting: Obstetrics and Gynecology

## 2019-01-17 DIAGNOSIS — O99824 Streptococcus B carrier state complicating childbirth: Secondary | ICD-10-CM | POA: Diagnosis present

## 2019-01-17 DIAGNOSIS — O9902 Anemia complicating childbirth: Secondary | ICD-10-CM | POA: Diagnosis present

## 2019-01-17 DIAGNOSIS — O26893 Other specified pregnancy related conditions, third trimester: Secondary | ICD-10-CM | POA: Diagnosis present

## 2019-01-17 DIAGNOSIS — Z20828 Contact with and (suspected) exposure to other viral communicable diseases: Secondary | ICD-10-CM | POA: Diagnosis present

## 2019-01-17 DIAGNOSIS — Z3A39 39 weeks gestation of pregnancy: Secondary | ICD-10-CM | POA: Diagnosis not present

## 2019-01-17 DIAGNOSIS — Z3490 Encounter for supervision of normal pregnancy, unspecified, unspecified trimester: Secondary | ICD-10-CM | POA: Diagnosis present

## 2019-01-17 DIAGNOSIS — O322XX0 Maternal care for transverse and oblique lie, not applicable or unspecified: Principal | ICD-10-CM | POA: Diagnosis present

## 2019-01-17 LAB — CBC
HCT: 32 % — ABNORMAL LOW (ref 36.0–46.0)
Hemoglobin: 10.5 g/dL — ABNORMAL LOW (ref 12.0–15.0)
MCH: 28.4 pg (ref 26.0–34.0)
MCHC: 32.8 g/dL (ref 30.0–36.0)
MCV: 86.5 fL (ref 80.0–100.0)
Platelets: 190 10*3/uL (ref 150–400)
RBC: 3.7 MIL/uL — ABNORMAL LOW (ref 3.87–5.11)
RDW: 13.8 % (ref 11.5–15.5)
WBC: 9 10*3/uL (ref 4.0–10.5)
nRBC: 0 % (ref 0.0–0.2)

## 2019-01-17 LAB — RPR: RPR Ser Ql: NONREACTIVE

## 2019-01-17 LAB — ABO/RH: ABO/RH(D): B POS

## 2019-01-17 MED ORDER — ZOLPIDEM TARTRATE 5 MG PO TABS: 5.0000 mg | ORAL_TABLET | Freq: Every evening | ORAL | Status: DC | PRN

## 2019-01-17 MED ORDER — PHENYLEPHRINE 40 MCG/ML (10ML) SYRINGE FOR IV PUSH (FOR BLOOD PRESSURE SUPPORT): 80.0000 ug | PREFILLED_SYRINGE | INTRAVENOUS | Status: DC | PRN

## 2019-01-17 MED ORDER — TETANUS-DIPHTH-ACELL PERTUSSIS 5-2.5-18.5 LF-MCG/0.5 IM SUSP: 0.5000 mL | Freq: Once | INTRAMUSCULAR | Status: DC

## 2019-01-17 MED ORDER — EPHEDRINE 5 MG/ML INJ: 10.0000 mg | INTRAVENOUS | Status: DC | PRN

## 2019-01-17 MED ORDER — LACTATED RINGERS IV SOLN: INTRAVENOUS | Status: DC

## 2019-01-17 MED ORDER — OXYTOCIN BOLUS FROM INFUSION
500.0000 mL | Freq: Once | INTRAVENOUS | Status: AC
  Administered 2019-01-17: 500 mL via INTRAVENOUS

## 2019-01-17 MED ORDER — OXYTOCIN 40 UNITS IN NORMAL SALINE INFUSION - SIMPLE MED
1.0000 m[IU]/min | INTRAVENOUS | Status: DC
  Administered 2019-01-17: 2 m[IU]/min via INTRAVENOUS
  Filled 2019-01-17: qty 1000

## 2019-01-17 MED ORDER — SENNOSIDES-DOCUSATE SODIUM 8.6-50 MG PO TABS
2.0000 | ORAL_TABLET | ORAL | Status: DC
  Administered 2019-01-18: 2 via ORAL
  Filled 2019-01-17: qty 2

## 2019-01-17 MED ORDER — PENICILLIN G POT IN DEXTROSE 60000 UNIT/ML IV SOLN
3.0000 10*6.[IU] | INTRAVENOUS | Status: DC
  Administered 2019-01-17 (×2): 3 10*6.[IU] via INTRAVENOUS
  Filled 2019-01-17 (×2): qty 50

## 2019-01-17 MED ORDER — ACETAMINOPHEN 325 MG PO TABS: 650.0000 mg | ORAL_TABLET | ORAL | Status: DC | PRN

## 2019-01-17 MED ORDER — OXYTOCIN 40 UNITS IN NORMAL SALINE INFUSION - SIMPLE MED: 2.5000 [IU]/h | INTRAVENOUS | Status: DC

## 2019-01-17 MED ORDER — COCONUT OIL OIL: 1.0000 "application " | TOPICAL_OIL | Status: DC | PRN

## 2019-01-17 MED ORDER — CEPHALEXIN 500 MG PO CAPS
500.0000 mg | ORAL_CAPSULE | Freq: Every day | ORAL | Status: DC
  Administered 2019-01-18: 500 mg via ORAL
  Filled 2019-01-17: qty 1

## 2019-01-17 MED ORDER — TERBUTALINE SULFATE 1 MG/ML IJ SOLN: 0.2500 mg | Freq: Once | INTRAMUSCULAR | Status: DC | PRN

## 2019-01-17 MED ORDER — SOD CITRATE-CITRIC ACID 500-334 MG/5ML PO SOLN: 30.0000 mL | ORAL | Status: DC | PRN

## 2019-01-17 MED ORDER — LACTATED RINGERS IV SOLN: 500.0000 mL | INTRAVENOUS | Status: DC | PRN

## 2019-01-17 MED ORDER — ONDANSETRON HCL 4 MG PO TABS: 4.0000 mg | ORAL_TABLET | ORAL | Status: DC | PRN

## 2019-01-17 MED ORDER — SODIUM CHLORIDE (PF) 0.9 % IJ SOLN
INTRAMUSCULAR | Status: DC | PRN
  Administered 2019-01-17: 12 mL/h via EPIDURAL

## 2019-01-17 MED ORDER — DIPHENHYDRAMINE HCL 25 MG PO CAPS: 25.0000 mg | ORAL_CAPSULE | Freq: Four times a day (QID) | ORAL | Status: DC | PRN

## 2019-01-17 MED ORDER — WITCH HAZEL-GLYCERIN EX PADS: 1.0000 "application " | MEDICATED_PAD | CUTANEOUS | Status: DC | PRN

## 2019-01-17 MED ORDER — LIDOCAINE HCL (PF) 1 % IJ SOLN: 30.0000 mL | INTRAMUSCULAR | Status: DC | PRN

## 2019-01-17 MED ORDER — SODIUM CHLORIDE 0.9 % IV SOLN
5.0000 10*6.[IU] | Freq: Once | INTRAVENOUS | Status: AC
  Administered 2019-01-17: 5 10*6.[IU] via INTRAVENOUS
  Filled 2019-01-17: qty 5

## 2019-01-17 MED ORDER — ONDANSETRON HCL 4 MG/2ML IJ SOLN: 4.0000 mg | Freq: Four times a day (QID) | INTRAMUSCULAR | Status: DC | PRN

## 2019-01-17 MED ORDER — DIBUCAINE (PERIANAL) 1 % EX OINT: 1.0000 "application " | TOPICAL_OINTMENT | CUTANEOUS | Status: DC | PRN

## 2019-01-17 MED ORDER — BENZOCAINE-MENTHOL 20-0.5 % EX AERO
1.0000 "application " | INHALATION_SPRAY | CUTANEOUS | Status: DC | PRN
  Administered 2019-01-17: 1 via TOPICAL
  Filled 2019-01-17: qty 56

## 2019-01-17 MED ORDER — DIPHENHYDRAMINE HCL 50 MG/ML IJ SOLN: 12.5000 mg | INTRAMUSCULAR | Status: DC | PRN

## 2019-01-17 MED ORDER — LACTATED RINGERS IV SOLN
500.0000 mL | Freq: Once | INTRAVENOUS | Status: AC
  Administered 2019-01-17: 500 mL via INTRAVENOUS

## 2019-01-17 MED ORDER — PRENATAL MULTIVITAMIN CH
1.0000 | ORAL_TABLET | Freq: Every day | ORAL | Status: DC
  Administered 2019-01-18: 1 via ORAL
  Filled 2019-01-17: qty 1

## 2019-01-17 MED ORDER — IBUPROFEN 600 MG PO TABS
600.0000 mg | ORAL_TABLET | Freq: Four times a day (QID) | ORAL | Status: DC
  Administered 2019-01-17 – 2019-01-18 (×4): 600 mg via ORAL
  Filled 2019-01-17 (×4): qty 1

## 2019-01-17 MED ORDER — SIMETHICONE 80 MG PO CHEW: 80.0000 mg | CHEWABLE_TABLET | ORAL | Status: DC | PRN

## 2019-01-17 MED ORDER — FENTANYL-BUPIVACAINE-NACL 0.5-0.125-0.9 MG/250ML-% EP SOLN
12.0000 mL/h | EPIDURAL | Status: DC | PRN
  Filled 2019-01-17: qty 250

## 2019-01-17 MED ORDER — ONDANSETRON HCL 4 MG/2ML IJ SOLN: 4.0000 mg | INTRAMUSCULAR | Status: DC | PRN

## 2019-01-17 MED ORDER — LIDOCAINE-EPINEPHRINE (PF) 2 %-1:200000 IJ SOLN
INTRAMUSCULAR | Status: DC | PRN
  Administered 2019-01-17 (×2): 2 mL via EPIDURAL

## 2019-01-17 NOTE — Progress Notes (Signed)
Comfortable with epidural  Temp:  [97.9 F (36.6 C)-98.3 F (36.8 C)] 97.9 F (36.6 C) (12/17 0457) Pulse Rate:  [76-97] 86 (12/17 1010) BP: (96-123)/(57-73) 116/72 (12/17 1010) Weight:  [82 kg] 82 kg (12/17 0036)  A&Ox3 nml respirations Abd: soft, nt, gravid Cx: 8/29/-5, cephalic  FHT: 621H, nml variability, +accels, no decels TOCO: irreg, 2-4 min  A/P: iup at 39.0 wga 1. Contin pitocin, plan svd; getting into active labor now 2. Fetal status reassuring 3. gbs positive- contin pcn

## 2019-01-17 NOTE — H&P (Addendum)
OB ADMISSION/ HISTORY & PHYSICAL:  Admission Date: 01/17/2019 12:03 AM  Admit Diagnosis: 39 weeks IOL for hx of rapid labor and favorable cervix   Alejandra Russo is a 28 y.o. female G2P1 at 4 weeks presenting for induction of labor for history of rapid labor, favorable cervix, and GBS positive status. She received first dose of penicillin over night prior to initiation of Pitocin.   Prenatal History: G2P1001   EDC : 01/24/2019, by Other B[facility]  Prenatal care at [facility] since 11 weeks  Primary Ob Provider: Dr. Murrell Redden  Prenatal course complicated by  - GBS positive  - Hx. 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 Anemia   . History of kidney stones   . Left ureteral stone      Past surgical history:  Past Surgical History:  Procedure Laterality Date  . CYSTOSCOPY W/ URETERAL STENT PLACEMENT Left 10/26/2015   Procedure: CYSTOSCOPY WITH STENT REPLACEMENT;  Surgeon: Cleon Gustin, MD;  Location: [facility];  Service: Urology;  Laterality: Left;  . CYSTOSCOPY WITH STENT PLACEMENT Left 10/02/2015   Procedure: CYSTOSCOPY WITH STENT PLACEMENT;  Surgeon: Cleon Gustin, MD;  Location: [facility];  Service: Urology;  Laterality: Left;  . CYSTOSCOPY/RETROGRADE/URETEROSCOPY/STONE EXTRACTION WITH BASKET  Left 10/26/2015   Procedure: CYSTOSCOPY/RETROGRADE/URETEROSCOPY/STONE EXTRACTION WITH BASKET;  Surgeon: Cleon Gustin, MD;  Location: [facility];  Service: Urology;  Laterality: Left;  . FOOT SURGERY    . Lapidus Fusion Right 06/13/2013   @ [facility]  . Lauro Regulus Bunionectomy Right 06/13/2013   @ [facility]    Family History: History reviewed. No pertinent family history.   Social History:  reports that she has never smoked. She has never used smokeless tobacco. She reports current alcohol use. She reports that she does not use drugs.   Allergies: Patient has no known allergies.    Current Medications at time of admission:  Prior to Admission medications   Medication Sig Start Date End Date Taking? Authorizing Provider  diazepam (VALIUM) 2 MG tablet Take 1 tablet (2 mg total) by mouth every 12 (twelve) hours as needed for muscle spasms. 10/03/15   McKenzie, Candee Furbish, MD  doxycycline (VIBRAMYCIN) 50 MG capsule Take 1 capsule (50 mg total) by mouth 2 (two) times daily. 10/26/15   McKenzie, Candee Furbish, MD  oxybutynin (DITROPAN XL) 10 MG 24 hr tablet Take 1 tablet (10 mg total) by mouth at bedtime. 10/03/15   McKenzie, Candee Furbish, MD  oxyCODONE-acetaminophen (PERCOCET/ROXICET) 5-325 MG tablet Take 1-2 tablets by mouth every 4 (four) hours as needed for moderate pain. 10/26/15   McKenzie, Candee Furbish, MD  phenazopyridine (PYRIDIUM) 100 MG tablet Take 1 tablet (100 mg total) by mouth 3 (three) times daily as needed for pain. 10/03/15   McKenzie, Candee Furbish, MD  sulfamethoxazole-trimethoprim (BACTRIM DS,SEPTRA  DS) 800-160 MG tablet Take 1 tablet by mouth 2 (two) times daily. 10/03/15   McKenzie, Mardene Celeste, MD     Review of Systems: Active FM Irregular contractions for several weeks  No LOF  / SROM  No bloody show    Physical Exam:  VS: Blood pressure 116/72, pulse 86, temperature 97.9 F (36.6 C), temperature source Oral, height 5\' 4"  (1.626 m), weight 82 kg.  General: alert and oriented,  appears comfortable Heart: RRR Lungs: Clear lung fields Abdomen: Gravid, soft and non-tender, non-distended / uterus: gravid, non-tender, EFW by Leopold's 7# Extremities: no edema  Genitalia / VE: Dilation: 3(outer os 5cm) Effacement (%): 50 Station: -3 Exam by:: [email or web address], RN  FHR: baseline rate 130 bpm / variability moderate / accelerations +15x15 / no decelerations TOCO: every 3-4  Assessment: [redacted] weeks gestation Latent stage of labor FHR category 1 GBS positive - s/p 3 doses of PCN    Plan:  1. Admit to [facility]   - Routine labor and delivery orders   - Pain management: epidural PRN  - Pitocin augmentation 2 milliunits x 2 milliunits  2. GBS Positive   - PCN prophylaxis  3. Postpartum:   - Breast 4. Anticipate MOD: NSVD   - Proven pelvis: 6#5oz   Dr. [employer] notified of admission / plan of care - CNM requested to manage this morning for MD  Amado Nash, MSN, CNM [facility]

## 2019-01-17 NOTE — Anesthesia Procedure Notes (Signed)
Epidural Patient location during procedure: OB Start time: 01/17/2019 9:35 AM End time: 01/17/2019 9:50 AM  Staffing Anesthesiologist: Freddrick March, MD Performed: anesthesiologist   Preanesthetic Checklist Completed: patient identified, IV checked, risks and benefits discussed, monitors and equipment checked, pre-op evaluation and timeout performed  Epidural Patient position: sitting Prep: DuraPrep and site prepped and draped Patient monitoring: continuous pulse ox, blood pressure, heart rate and cardiac monitor Approach: midline Location: L3-L4 Injection technique: LOR air  Needle:  Needle type: Tuohy  Needle gauge: 17 G Needle length: 9 cm Needle insertion depth: 5 cm Catheter type: closed end flexible Catheter size: 19 Gauge Catheter at skin depth: 11 cm Test dose: negative  Assessment Sensory level: T8 Events: blood not aspirated, injection not painful, no injection resistance, no paresthesia and negative IV test  Additional Notes Patient identified. Risks/Benefits/Options discussed with patient including but not limited to bleeding, infection, nerve damage, paralysis, failed block, incomplete pain control, headache, blood pressure changes, nausea, vomiting, reactions to medication both or allergic, itching and postpartum back pain. Confirmed with bedside nurse the patient's most recent platelet count. Confirmed with patient that they are not currently taking any anticoagulation, have any bleeding history or any family history of bleeding disorders. Patient expressed understanding and wished to proceed. All questions were answered. Sterile technique was used throughout the entire procedure. Please see nursing notes for vital signs. Test dose was given through epidural catheter and negative prior to continuing to dose epidural or start infusion. Warning signs of high block given to the patient including shortness of breath, tingling/numbness in hands, complete motor block,  or any concerning symptoms with instructions to call for help. Patient was given instructions on fall risk and not to get out of bed. All questions and concerns addressed with instructions to call with any issues or inadequate analgesia.  Reason for block:procedure for pain

## 2019-01-17 NOTE — Progress Notes (Signed)
Induction of labor for favorable cervix.  S:  Comfortable with epidural; no complaints; discussed AROM with patient and she agrees   O:  Pitocin at 8 milliunits  VS: Blood pressure 108/73, pulse 83, temperature 97.9 F (36.6 C), temperature source Oral, height 5\' 4"  (1.626 m), weight 82 kg.        FHR : baseline 130 bpm/ variability mdoerate / accelerations +15x15/ (+) scalp stimulation / no decelerations        Toco: contractions every 3-4 minutes / moderate         Cervix :  4cm/80%/-1        Membranes: s/p AROM - no fluid return despite AROM with contraction and mild fundal pressure; bloody mucoid discharge noted   A: Latent labor     FHR category 1     GBS positive - s/p Penicillin x 3 doses  P: Will monitor for amniotic fluid return     Reassess in 1-2 hours    Anticipate NSVD  Dr. Murrell Redden updated with patient status     Lars Pinks, MSN, CNM [facility]

## 2019-01-17 NOTE — Progress Notes (Signed)
Pt assessed, SVD by CNM, long right periurethral tear and 1st degree perineal tear, red rubber cath placed and both repaired, no complications. Catheter removed after repair.  See CNM delivery note.  --V.Benjie Karvonen, MD

## 2019-01-17 NOTE — Anesthesia Preprocedure Evaluation (Signed)
Anesthesia Evaluation  Patient identified by MRN, date of birth, ID band Patient awake    Reviewed: Allergy & Precautions, NPO status , Patient's Chart, lab work & pertinent test results  Airway Mallampati: II  TM Distance: >3 FB Neck ROM: Full    Dental no notable dental hx.    Pulmonary neg pulmonary ROS,    Pulmonary exam normal breath sounds clear to auscultation       Cardiovascular negative cardio ROS Normal cardiovascular exam Rhythm:Regular Rate:Normal     Neuro/Psych negative neurological ROS  negative psych ROS   GI/Hepatic negative GI ROS, Neg liver ROS,   Endo/Other  negative endocrine ROS  Renal/GU negative Renal ROS  negative genitourinary   Musculoskeletal negative musculoskeletal ROS (+)   Abdominal   Peds  Hematology  (+) Blood dyscrasia, anemia ,   Anesthesia Other Findings   Reproductive/Obstetrics (+) Pregnancy                             Anesthesia Physical Anesthesia Plan  ASA: II  Anesthesia Plan: Epidural   Post-op Pain Management:    Induction:   PONV Risk Score and Plan: Treatment may vary due to age or medical condition  Airway Management Planned: Natural Airway  Additional Equipment:   Intra-op Plan:   Post-operative Plan:   Informed Consent: I have reviewed the patients History and Physical, chart, labs and discussed the procedure including the risks, benefits and alternatives for the proposed anesthesia with the patient or authorized representative who has indicated his/her understanding and acceptance.       Plan Discussed with: Anesthesiologist  Anesthesia Plan Comments: (Patient identified. Risks, benefits, options discussed with patient including but not limited to bleeding, infection, nerve damage, paralysis, failed block, incomplete pain control, headache, blood pressure changes, nausea, vomiting, reactions to medication, itching, and  post partum back pain. Confirmed with bedside nurse the patient's most recent platelet count. Confirmed with the patient that they are not taking any anticoagulation, have any bleeding history or any family history of bleeding disorders. Patient expressed understanding and wishes to proceed. All questions were answered. )        Anesthesia Quick Evaluation  

## 2019-01-17 NOTE — Lactation Note (Addendum)
This note was copied from a baby's chart. Lactation Consultation Note  Patient Name: Alejandra Russo [id number] Date: 01/17/2019 Reason for consult: Initial assessment P2, 9 hour female infant. This is mom's third time latching infant at breast. Infant was very fussy and infant had last breastfeed 4 hours ago. Per mom, she has mass in her right breast and it is benign she will not have a follow up, everything is fine.  Mom has colostrum present both breast.  LC observed mom has long nipples. Mom has been offering infant a pacifier. LC suggested if mom can delay pacifier usage until 3 or 4 weeks because it may cause disorganize suck, cue out hunger cues.  Per mom, she has DEBP at home and she breastfeed her 44 year old son for 4 months. LC suggested mom try the cross cradle hold, infant was on and off breast very fussy and wants to suck on nipple tip or slide up and down nipple. Mom position infant back in cradle position without assistance,  infant was pulling nipple sliding up and down, LC suggest to mom to hold infant closer to  breast,  to avoid sore nipples.  Mom was still breastfeeding infant with cradle hold but infant was not sustaining latch, kept pulling off breast.  Mom  body posture and language became more hands off. LC mention to RN and she will try observe a latch later tonight with mom and baby.  LC mention to mom breastfeed infant according to hunger cues, 8 to 12 times within 24 hours and not exceed 3 hours without breastfeeding infant. Mom will continue to do as much STS as possible. Mom knows to call RN or LC if she has any questions, concerns or need assistance with latching infant at breast. Reviewed Baby & Me book's Breastfeeding Basics.  Mom made aware of O/P services, breastfeeding support groups, community resources, and our phone # for post-discharge questions.  Maternal Data Formula Feeding for Exclusion: No Has patient been taught Hand Expression?: Yes Does  the patient have breastfeeding experience prior to this delivery?: Yes  Feeding Feeding Type: Breast Fed  LATCH Score Latch: Repeated attempts needed to sustain latch, nipple held in mouth throughout feeding, stimulation needed to elicit sucking reflex.  Audible Swallowing: A few with stimulation  Type of Nipple: Everted at rest and after stimulation  Comfort (Breast/Nipple): Soft / non-tender  Hold (Positioning): Assistance needed to correctly position infant at breast and maintain latch.  LATCH Score: 7  Interventions Interventions: Breast feeding basics reviewed;Breast compression;Assisted with latch;Adjust position;Skin to skin;Support pillows;Breast massage;Position options;Hand express;Expressed milk  Lactation Tools Discussed/Used WIC Program: No   Consult Status Consult Status: Follow-up Date: 01/18/19 Follow-up type: In-patient    Vicente Serene 01/17/2019, 11:47 PM

## 2019-01-18 ENCOUNTER — Encounter (HOSPITAL_COMMUNITY): Payer: Self-pay | Admitting: Obstetrics and Gynecology

## 2019-01-18 DIAGNOSIS — O9902 Anemia complicating childbirth: Secondary | ICD-10-CM | POA: Diagnosis present

## 2019-01-18 LAB — TYPE AND SCREEN
ABO/RH(D): B POS
Antibody Screen: NEGATIVE

## 2019-01-18 LAB — CBC
HCT: 29.6 % — ABNORMAL LOW (ref 36.0–46.0)
Hemoglobin: 9.7 g/dL — ABNORMAL LOW (ref 12.0–15.0)
MCH: 28 pg (ref 26.0–34.0)
MCHC: 32.8 g/dL (ref 30.0–36.0)
MCV: 85.5 fL (ref 80.0–100.0)
Platelets: 160 10*3/uL (ref 150–400)
RBC: 3.46 MIL/uL — ABNORMAL LOW (ref 3.87–5.11)
RDW: 13.9 % (ref 11.5–15.5)
WBC: 12.1 10*3/uL — ABNORMAL HIGH (ref 4.0–10.5)
nRBC: 0 % (ref 0.0–0.2)

## 2019-01-18 MED ORDER — BENZOCAINE-MENTHOL 20-0.5 % EX AERO: 1.0000 "application " | INHALATION_SPRAY | CUTANEOUS | Status: AC | PRN

## 2019-01-18 MED ORDER — MAGNESIUM OXIDE 400 (241.3 MG) MG PO TABS
400.0000 mg | ORAL_TABLET | Freq: Every day | ORAL | Status: DC
  Administered 2019-01-18: 400 mg via ORAL
  Filled 2019-01-18: qty 1

## 2019-01-18 MED ORDER — COCONUT OIL OIL: 1.0000 "application " | TOPICAL_OIL | 0 refills | Status: AC | PRN

## 2019-01-18 MED ORDER — POLYSACCHARIDE IRON COMPLEX 150 MG PO CAPS
150.0000 mg | ORAL_CAPSULE | Freq: Every day | ORAL | Status: DC
  Administered 2019-01-18: 150 mg via ORAL
  Filled 2019-01-18: qty 1

## 2019-01-18 MED ORDER — POLYSACCHARIDE IRON COMPLEX 150 MG PO CAPS: 150.0000 mg | ORAL_CAPSULE | Freq: Every day | ORAL | Status: AC

## 2019-01-18 MED ORDER — CEPHALEXIN 500 MG PO CAPS: 500.0000 mg | ORAL_CAPSULE | Freq: Every day | ORAL | 0 refills | Status: DC

## 2019-01-18 MED ORDER — IBUPROFEN 600 MG PO TABS: 600.0000 mg | ORAL_TABLET | Freq: Four times a day (QID) | ORAL | 0 refills | Status: AC

## 2019-01-18 MED ORDER — MAGNESIUM OXIDE 400 (241.3 MG) MG PO TABS: 400.0000 mg | ORAL_TABLET | Freq: Every day | ORAL | Status: AC

## 2019-01-18 MED ORDER — ACETAMINOPHEN 500 MG PO TABS: 1000.0000 mg | ORAL_TABLET | Freq: Four times a day (QID) | ORAL | 2 refills | Status: AC | PRN

## 2019-01-18 NOTE — Anesthesia Postprocedure Evaluation (Signed)
Anesthesia Post Note  Patient: Alejandra Russo [facility]  Procedure(s) Performed: AN AD HOC LABOR EPIDURAL     Patient location during evaluation: Mother Baby Anesthesia Type: Epidural Level of consciousness: awake and alert Pain management: pain level controlled Vital Signs Assessment: post-procedure vital signs reviewed and stable Respiratory status: spontaneous breathing, nonlabored ventilation and respiratory function stable Cardiovascular status: stable Postop Assessment: no headache, no backache and epidural receding Anesthetic complications: no    Last Vitals:  Vitals:   01/18/19 0003 01/18/19 0406  BP: 104/69 104/71  Pulse: 82 67  Resp: 18 18  Temp: 36.6 C 36.7 C  SpO2: 100% 100%    Last Pain:  Vitals:   01/18/19 0538  TempSrc:   PainSc: 0-No pain   Pain Goal:                   [name]

## 2019-01-18 NOTE — Lactation Note (Signed)
This note was copied from a baby's chart. Lactation Consultation Note  Patient Name: Alejandra Russo TRVUY'[id number] Date: 01/18/2019   P2, Baby 9 hours old and mother latched baby upon entering. Discussed bf on demand, frequency and feeding on both breasts per session. Mother bf her first child for 3-4 mos and wants to bf this child longer. Feed on demand with cues.  Goal 8-12+ times per day after first 24 hrs.  Place baby STS if not cueing.  Reviewed engorgement care and monitoring voids/stools.      Maternal Data    Feeding Feeding Type: Breast Milk  LATCH Score                   Interventions    Lactation Tools Discussed/Used     Consult Status      Carlye Grippe 01/18/2019, 11:23 AM

## 2019-01-18 NOTE — Discharge Summary (Signed)
Obstetric Discharge Summary Reason for Admission: induction of labor and history precipitous labor Prenatal Procedures: ultrasound Intrapartum Procedures: spontaneous vaginal delivery, GBS prophylaxis and Pitocin, AROM, epidural Postpartum Procedures: none Complications-Operative and Postpartum: 1st degree perineal laceration and periurethral Hemoglobin  Date Value Ref Range Status  01/18/2019 9.7 (L) 12.0 - 15.0 g/dL Final   HCT  Date Value Ref Range Status  01/18/2019 29.6 (L) 36.0 - 46.0 % Final    Physical Exam:  General: alert, cooperative and no distress Lochia: appropriate Uterine Fundus: firm Incision: healing well DVT Evaluation: No cords or calf tenderness. No significant calf/ankle edema.  Discharge Diagnoses: Principal Problem:   Postpartum care following vaginal delivery (12/17) Active Problems:   Encounter for planned induction of labor   SVD (spontaneous vaginal delivery)   First degree perineal laceration   Obstetrical laceration: right periurethral    Maternal anemia, with delivery   Discharge Information: Date: 01/18/2019 Activity: pelvic rest Diet: routine Medications:  Allergies as of 01/18/2019   No Known Allergies     Medication List    STOP taking these medications   diazepam 2 MG tablet Commonly known as: Valium   doxycycline 50 MG capsule Commonly known as: VIBRAMYCIN   oxybutynin 10 MG 24 hr tablet Commonly known as: Ditropan XL   oxyCODONE-acetaminophen 5-325 MG tablet Commonly known as: PERCOCET/ROXICET   phenazopyridine 100 MG tablet Commonly known as: Pyridium   sulfamethoxazole-trimethoprim 800-160 MG tablet Commonly known as: BACTRIM DS     TAKE these medications   acetaminophen 500 MG tablet Commonly known as: TYLENOL Take 2 tablets (1,000 mg total) by mouth every 6 (six) hours as needed.   benzocaine-Menthol 20-0.5 % Aero Commonly known as: DERMOPLAST Apply 1 application topically as needed for irritation  (perineal discomfort).   cephALEXin 500 MG capsule Commonly known as: KEFLEX Take 1 capsule (500 mg total) by mouth daily.   coconut oil Oil Apply 1 application topically as needed.   ibuprofen 600 MG tablet Commonly known as: ADVIL Take 1 tablet (600 mg total) by mouth every 6 (six) hours.   iron polysaccharides 150 MG capsule Commonly known as: Ferrex 150 Take 1 capsule (150 mg total) by mouth daily.   magnesium oxide 400 (241.3 Mg) MG tablet Commonly known as: MAG-OX Take 1 tablet (400 mg total) by mouth daily.            Discharge Care Instructions  (From admission, onward)         Start     Ordered   01/18/19 0000  Discharge wound care:    Comments: Sitz baths 2 times /day with warm water x 1 week. May add herbals: 1 ounce dried comfrey leaf* 1 ounce calendula flowers 1 ounce lavender flowers 1/2 ounce dried uva ursi leaves 1/2 ounce witch hazel blossoms (if you can find them) 1/2 ounce dried sage leaf 1/2 cup sea salt Directions: Bring 2 quarts of water to a boil. Turn off heat, and place 1 ounce (approximately 1 large handful) of the above mixed herbs (not the salt) into the pot. Steep, covered, for 30 minutes.  Strain the liquid well with a fine mesh strainer, and discard the herb material. Add 2 quarts of liquid to the tub, along with the 1/2 cup of salt. This medicinal liquid can also be made into compresses and peri-rinses.   01/18/19 0934         Condition: stable Instructions: refer to practice specific booklet Discharge to: home Follow-up Information    Almquist,  Candace Gallus, MD. Schedule an appointment as soon as possible for a visit in 6 week(s).   Specialty: Obstetrics and Gynecology Contact information: [address] [place] [address] [phone]           Newborn Data: Live born female Jolie Birth Weight: 6 lb 14 oz (3118 g) APGAR: 8, 9  Newborn Delivery   Birth date/time: 01/17/2019 13:56:00 Delivery type: Vaginal,  Spontaneous      Home with mother.  Neta Mends, CNM 01/18/2019, 9:35 AM

## 2021-10-04 ENCOUNTER — Other Ambulatory Visit: Payer: Self-pay

## 2021-10-04 ENCOUNTER — Emergency Department (HOSPITAL_BASED_OUTPATIENT_CLINIC_OR_DEPARTMENT_OTHER): Payer: No Typology Code available for payment source

## 2021-10-04 ENCOUNTER — Emergency Department (HOSPITAL_BASED_OUTPATIENT_CLINIC_OR_DEPARTMENT_OTHER)
Admission: EM | Admit: 2021-10-04 | Discharge: 2021-10-04 | Disposition: A | Discharge status: Home / Self Care | Payer: No Typology Code available for payment source | Attending: Emergency Medicine | Admitting: Emergency Medicine

## 2021-10-04 ENCOUNTER — Encounter (HOSPITAL_BASED_OUTPATIENT_CLINIC_OR_DEPARTMENT_OTHER): Payer: Self-pay | Admitting: Emergency Medicine

## 2021-10-04 DIAGNOSIS — N12 Tubulo-interstitial nephritis, not specified as acute or chronic: Secondary | ICD-10-CM | POA: Diagnosis not present

## 2021-10-04 DIAGNOSIS — R109 Unspecified abdominal pain: Secondary | ICD-10-CM | POA: Diagnosis present

## 2021-10-04 LAB — PREGNANCY, URINE: Preg Test, Ur: NEGATIVE

## 2021-10-04 LAB — URINALYSIS, ROUTINE W REFLEX MICROSCOPIC
Bilirubin Urine: NEGATIVE
Glucose, UA: NEGATIVE mg/dL
Hgb urine dipstick: NEGATIVE
Ketones, ur: NEGATIVE mg/dL
Nitrite: NEGATIVE
Protein, ur: NEGATIVE mg/dL
Specific Gravity, Urine: 1.022 (ref 1.005–1.030)
pH: 5.5 (ref 5.0–8.0)

## 2021-10-04 MED ORDER — CEFDINIR 300 MG PO CAPS
300.0000 mg | ORAL_CAPSULE | Freq: Two times a day (BID) | ORAL | Status: DC
  Administered 2021-10-04: 300 mg via ORAL
  Filled 2021-10-04: qty 1

## 2021-10-04 MED ORDER — CEFDINIR 300 MG PO CAPS: 300.0000 mg | ORAL_CAPSULE | Freq: Two times a day (BID) | ORAL | 0 refills | Status: AC

## 2021-10-04 MED ORDER — KETOROLAC TROMETHAMINE 60 MG/2ML IM SOLN
60.0000 mg | Freq: Once | INTRAMUSCULAR | Status: AC
  Administered 2021-10-04: 60 mg via INTRAMUSCULAR
  Filled 2021-10-04: qty 2

## 2021-10-04 NOTE — ED Triage Notes (Signed)
Pt reports left flank pain for 4-5 days, taking tylenol OTC with minimal relief. Denies dysuria or hematuria.  H/o kidney stones.

## 2021-10-04 NOTE — ED Provider Notes (Signed)
[facility] EMERGENCY DEPT Provider Note   CSN: [id number] Arrival date & time: 10/04/21  1023     History  Chief Complaint  Patient presents with   Flank Pain    Alejandra Russo is a 31 y.o. female.  HPI     Thursday had an episode, took tylenol, was improved until Saturday, Saturday came back but wasn't bad, this morning was excrutiating pain, stabbing pain to left flank.  Sitting feels like more pressure, when stand up not as bad.  No dyspnea, is not worse with deep breaths, no chest pain.  1.5 months ago ahd surgery bunionectomy, bearing some weight now with crutch Hx of kidney stone, not sure if it feels the same No nausea, vomiting No dysuria More frequency urine No constipation, diarrhea, fevers, chills   Past Medical History:  Diagnosis Date   Anemia    History of kidney stones    Left ureteral stone     Past Surgical History:  Procedure Laterality Date   CYSTOSCOPY W/ URETERAL STENT PLACEMENT Left 10/26/2015   Procedure: CYSTOSCOPY WITH STENT REPLACEMENT;  Surgeon: Malen Gauze, MD;  Location: [facility];  Service: Urology;  Laterality: Left;   CYSTOSCOPY WITH STENT PLACEMENT Left 10/02/2015   Procedure: CYSTOSCOPY WITH STENT PLACEMENT;  Surgeon: Malen Gauze, MD;  Location: [facility];  Service: Urology;  Laterality: Left;   CYSTOSCOPY/RETROGRADE/URETEROSCOPY/STONE EXTRACTION WITH BASKET Left 10/26/2015   Procedure: CYSTOSCOPY/RETROGRADE/URETEROSCOPY/STONE EXTRACTION WITH BASKET;  Surgeon: Malen Gauze, MD;  Location: [facility];  Service: Urology;  Laterality: Left;   FOOT SURGERY     Lapidus Fusion Right 06/13/2013   @ [facility]   McBride Bunionectomy Right 06/13/2013   @ [facility]    Home Medications Prior to Admission medications   Medication Sig Start Date End Date Taking? Authorizing Provider  cefdinir (OMNICEF) 300 MG capsule Take 1 capsule (300 mg total) by mouth 2 (two) times daily for 14 days. 10/04/21  10/18/21 Yes Romaldo Saville, Denny Peon, MD  benzocaine-Menthol (DERMOPLAST) 20-0.5 % AERO Apply 1 application topically as needed for irritation (perineal discomfort). 01/18/19   Neta Mends, CNM  coconut oil OIL Apply 1 application topically as needed. 01/18/19   Neta Mends, CNM  ibuprofen (ADVIL) 600 MG tablet Take 1 tablet (600 mg total) by mouth every 6 (six) hours. 01/18/19   Neta Mends, CNM  iron polysaccharides (FERREX 150) 150 MG capsule Take 1 capsule (150 mg total) by mouth daily. 01/18/19   Neta Mends, CNM  magnesium oxide (MAG-OX) 400 (241.3 Mg) MG tablet Take 1 tablet (400 mg total) by mouth daily. 01/18/19   Neta Mends, CNM      Allergies    Patient has no known allergies.    Review of Systems   Review of Systems  Physical Exam Updated Vital Signs BP 105/70 (BP Location: Right Arm)   Pulse 75   Temp 98 F (36.7 C) (Oral)   Resp 16   LMP  (LMP Unknown) Comment: Pt does not get monthly cycles per her BC.  SpO2 99%  Physical Exam Vitals and nursing note reviewed.  Constitutional:      General: She is not in acute distress.    Appearance: She is well-developed. She is not diaphoretic.  HENT:     Head: Normocephalic and atraumatic.  Eyes:     Conjunctiva/sclera: Conjunctivae normal.  Cardiovascular:     Rate and Rhythm: Normal rate and regular rhythm.  Heart sounds: Normal heart sounds. No murmur heard.    No friction rub. No gallop.  Pulmonary:     Effort: Pulmonary effort is normal. No respiratory distress.     Breath sounds: Normal breath sounds. No wheezing or rales.  Abdominal:     General: There is no distension.     Palpations: Abdomen is soft.     Tenderness: There is no abdominal tenderness. There is left CVA tenderness. There is no guarding.  Musculoskeletal:        General: No tenderness.     Cervical back: Normal range of motion.  Skin:    General: Skin is warm and dry.     Findings: No erythema or rash.  Neurological:     Mental  Status: She is alert and oriented to person, place, and time.     ED Results / Procedures / Treatments   Labs (all labs ordered are listed, but only abnormal results are displayed) Labs Reviewed  URINALYSIS, ROUTINE W REFLEX MICROSCOPIC - Abnormal; Notable for the following components:      Result Value   Leukocytes,Ua MODERATE (*)    Bacteria, UA FEW (*)    All other components within normal limits  URINE CULTURE  PREGNANCY, URINE    EKG None  Radiology CT Renal Stone Study  Result Date: 10/04/2021 CLINICAL DATA:  Left flank pain for 5 days.  Nephrolithiasis. EXAM: CT ABDOMEN AND PELVIS WITHOUT CONTRAST TECHNIQUE: Multidetector CT imaging of the abdomen and pelvis was performed following the standard protocol without IV contrast. RADIATION DOSE REDUCTION: This exam was performed according to the departmental dose-optimization program which includes automated exposure control, adjustment of the mA and/or kV according to patient size and/or use of iterative reconstruction technique. COMPARISON:  10/02/2015 FINDINGS: Lower chest: No acute findings. Hepatobiliary:  No mass visualized on this unenhanced exam. Pancreas: No mass or inflammatory process visualized on this unenhanced exam. Spleen:  Within normal limits in size. Adrenals/Urinary tract: Several small less than 1 cm renal calculi are again seen bilaterally. No evidence of ureteral calculi or hydronephrosis. Unremarkable unopacified urinary bladder. Stomach/Bowel: No evidence of obstruction, inflammatory process, or abnormal fluid collections. Normal appendix visualized. Vascular/Lymphatic: No pathologically enlarged lymph nodes identified. No evidence of abdominal aortic aneurysm. Reproductive: IUD seen in appropriate position. No mass or other significant abnormality. Other:  None. Musculoskeletal:  No suspicious bone lesions identified. IMPRESSION: Bilateral nephrolithiasis. No evidence of ureteral calculi, hydronephrosis, or other  acute findings. Electronically Signed   By: Danae Orleans M.D.   On: 10/04/2021 12:32    Procedures Procedures    Medications Ordered in ED Medications  ketorolac (TORADOL) injection 60 mg (60 mg Intramuscular Given 10/04/21 1320)    ED Course/ Medical Decision Making/ A&P                           Medical Decision Making Amount and/or Complexity of Data Reviewed Labs: ordered. Radiology: ordered.  Risk Prescription drug management.   31yo female with history of kidney stones who presents with concern for left flank pain.    DDx includes appendicitis, pancreatitis, cholecystitis, pyelonephritis, nephrolithiasis, diverticulitis, PID, ovarian torsion, ectopic pregnancy, PE and tuboovarian abscess.  Not having epigastric pain or tenderness to suggest pancreatitis, cholecystitis, hepatitis.  CT stone study shows bilateral nephrolithiasis without signs of ureteral calculi or obstructive findings. No sign of appendicitis, no sign of diverticulitis or SBO.  No ovarian mass on CT and low clinical suspicion  for ovarian torsion.  Low clinical suspicion for PID/TOA by hx and exam.  No dyspnea, hypoxia, normal HR, no pleuritic pain and low clinical suspicion for PE.  UA consistent with likely UTI/pyelonephritis with moderate leukocytes, symptoms of urinary frequency and flank pain--suspect symptoms secondary to UTI. Given rx for cefdinir, recommend ibuprofen and tylenol. Patient discharged in stable condition with understanding of reasons to return.         Final Clinical Impression(s) / ED Diagnoses Final diagnoses:  Pyelonephritis    Rx / DC Orders ED Discharge Orders          Ordered    cefdinir (OMNICEF) 300 MG capsule  2 times daily        10/04/21 1254              Alvira Monday, MD 10/05/21 1704

## 2021-10-05 LAB — URINE CULTURE: Culture: NO GROWTH
